# Patient Record
Sex: Male | Born: 1990
Health system: Southern US, Community
[De-identification: ages and names within clinical notes are randomized; demographics above are authoritative.]

## PROBLEM LIST (undated history)

## (undated) HISTORY — PX: KNEE SURGERY: SHX244

## (undated) HISTORY — PX: FOOT SURGERY: SHX648

---
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
## 2010-05-28 NOTE — Op Note (Signed)
NAME:  Stephen Frey, SELIGA NO.:  [id number]   MEDICAL RECORD NO.:  [id number]          PATIENT TYPE:  AMB   LOCATION:  SDS                          FACILITY:  [facility]   PHYSICIAN:  Claude Manges. Whitfield, M.D.DATE OF BIRTH:  28-Jul-1990   DATE OF PROCEDURE:  06/24/2005  DATE OF DISCHARGE:                                 OPERATIVE REPORT   PREOPERATIVE DIAGNOSIS:  Abscess left foot.   POSTOPERATIVE DIAGNOSIS:  Abscess left foot.   PROCEDURE:  Incision and drainage abscess left foot.   SURGEON:  Claude Manges. Cleophas Dunker, M.D.   ASSISTANT:  Arlys John D. Petrarca, P.A.-C.   ANESTHESIA:  General orotracheal.   COMPLICATIONS:  None.   HISTORY:  20 year old young man stepped with his barefoot on a tack  approximately 12 days ago. He was treated at home in [place] with Cipro  and eventually Levaquin, but continued to have problems with swelling and  pain in his foot.  He was seen in the office today with evidence of a  plantar abscess with pointing dorsally at the base of the second metatarsal  phalangeal joint.  He had an MRI scan this afternoon that revealed soft  tissue abscess without evidence of osteomyelitis or involvement of the  metatarsal phalangeal joint of the first, second, or third toe.  He is now  to have incision and drainage of the abscess.   PROCEDURE:  With the patient comfortable on the operating table and under  general laryngeal anesthesia, the left foot was prepped with DuraPrep from  the tips of the toes to the proximal leg.  Sterile draping was performed.  The extremity was elevated, it was Esmarch exsanguinated with the Esmarch  starting at the midfoot extending proximally.  There had been a small I&D  site performed previously that was probably about 3-4 mm in length. Applying  pressure on the plantar aspect of foot, we were able to express purulence.  This was sent for both anaerobic and aerobic culture.  I extended the open  area more proximally at  which point we encountered more purulence and what  appeared to be in infected tissue.  There was some necrotic tissue, as well,  which was sent for culture and sensitivity.  The wound was then further  opened bluntly. There was an abscessed cavity that was identified and this  was copiously irrigated with saline solution.  I did not see any further  purulence.  I could feel the flexor tendons but did not enter the joint.  The wound was again irrigated with warm saline solution and packed with  Iodoform gauze and a sterile bulky dressing was applied.  The patient  tolerated well without complications.      Claude Manges. Cleophas Dunker, M.D.    PWW/MEDQ  D:  06/24/2005  T:  06/25/2005  Job:  [id number]

## 2010-05-28 NOTE — Discharge Summary (Signed)
NAME:  Stephen Frey, Stephen Frey NO.:  [id number]   MEDICAL RECORD NO.:  [id number]          PATIENT TYPE:  INP   LOCATION:  6122                         FACILITY:  [facility]   PHYSICIAN:  Claude Manges. Whitfield, M.D.DATE OF BIRTH:  [birth year]   DATE OF ADMISSION:  06/24/2005  DATE OF DISCHARGE:  06/27/2005                                 DISCHARGE SUMMARY   ADMISSION DIAGNOSES:  1. Abscess left foot.  2. History of headaches.   DISCHARGE DIAGNOSES:  1. Abscess left foot with cellulitis and positive for methicillin-      resistant Staphylococcus aureus.  2. Reaction to vancomycin intravenous.  3. History of headaches.   SURGICAL PROCEDURES:  On June 24, 2005 Stephen Frey underwent an incision and  drainage of the abscess of the left foot by Dr. Claude Manges.  Whitfield assisted  by Jacqualine Code, P.A.-C.   COMPLICATIONS:  None.   CONSULTS:  1. Infectious disease consult by Dr. Ninetta Lights done on June 24, 2005  2. Pharmacy consult for vancomycin therapy June 24, 2005  3. PT consult June 25, 2005 in addition to a case management consult   HISTORY OF PRESENT ILLNESS:  This 20 year old white male patient presented  to Dr. Cleophas Dunker after stepping on a tack on June 4 in his bare feet.  It  was treated with soap and water and alcohol and Neosporin and he did fine  until June 9 when he developed increased pain.  It was treated then with  Epsom salts, however, continued to increase pain and saw a medical doctor on  June 11 and was started on Cipro and a tetanus booster.  By June 13 it was  unimproved and he was having increased redness and antibiotic was switched  to Levaquin.  He was then seen in our office on the day of admission and I&D  was done in the office and then sent for an MRI.  He is being admitted now  for I&D of an abscess.   HOSPITAL COURSE:  Stephen Frey was admitted initially and seen by infectious  disease.  They recommended treatment with vancomycin initially.  He  underwent surgery later in the day and tolerated that well.  On postop day  one Tmax was 97.9, vitals were stable.  Foot was improved with decreased  redness.  He was started on therapy per protocol.  Infectious disease  continued to see him and did recommend the vancomycin even though he had had  a reaction the night before with some swelling and redness.  They  recommended treating him with Benadryl prior to the antibiotic infusion and  continuing that at this time.  He did tolerate that that evening.   On postop day two improvement of the foot was significant.  Culture had  shown Staph but no sensitivities were available at that time.  He was  afebrile, vitals stable.  He was continued on antibiotics.   On June 18 he was doing well and was felt to be close to ready for discharge  home.  His culture report did show few MRSA  but it was sensitive to  gentamycin, Levaquin, Bactrim, vancomycin, rifampin, and tetracycline.  Infectious disease felt he would be okay to be discharged home on oral  doxycycline.  He did tolerate that dose without difficulty.  He was  discharged home later that day.   DISCHARGE INSTRUCTIONS:   DIET:  He can resume his regular pre hospitalization diet.   ACTIVITY:  He can be out of bed weightbearing as tolerated on the left foot  with use of crutches.   WOUND CARE:  He is to keep the dressing clean and dry and follow up Dr.  Cleophas Dunker in our office Wednesday after discharge.  He is not to get that  foot wet nor to change the dressing.  He is to notify Dr. Cleophas Dunker of  temperature greater than 101.5, chills, pain unrelieved by pain meds, or  foul swelling drainage from the wound.   MEDICATIONS:  He may resume his home meds.  In addition to that he has  doxycycline 100 mg p.o. b.i.d. for 10 days.   FOLLOW-UP:  Follow up with Dr. Cleophas Dunker in our office on Wednesday after  discharge.   LABORATORY DATA:  MRI done of the left foot on June 15 showed  phlegmonous  changes within the plantar soft tissue of the level of second  intermetatarsal interphalangeal space, negative for soft tissue abscess,  evidence of osteomyelitis, or septic joint.   Hemoglobin/hematocrit ranged from 14.4 and 41.9 on the 15th to 13.9 and 39.7  on the 16th.  White count went from 8.6 on the 15th to 12.3 on the 16th.  Platelets went from 167 on the 15th to 147 on the 16th.   Glucose was elevated on June 15 at 101.   Moderate white cells were obtained from the left foot abscess with rare  squamous epithelial cells, but no organisms seen but it did grow out a few  MRSA which was sensitive to gentamycin, Levaquin, rifampin, tetracycline,  Bactrim, and vancomycin.  All other laboratory studies were within normal  limits.      Stephen Frey, P.A.      Claude Manges. Cleophas Dunker, M.D.  Electronically Signed    KED/MEDQ  D:  08/30/2005  T:  08/30/2005  Job:  [id number]

## 2015-10-11 DIAGNOSIS — R110 Nausea: Secondary | ICD-10-CM | POA: Diagnosis not present

## 2015-10-11 DIAGNOSIS — R6889 Other general symptoms and signs: Secondary | ICD-10-CM | POA: Diagnosis not present

## 2015-10-11 DIAGNOSIS — J069 Acute upper respiratory infection, unspecified: Secondary | ICD-10-CM | POA: Diagnosis not present

## 2016-02-15 ENCOUNTER — Emergency Department (HOSPITAL_COMMUNITY)
Admission: EM | Admit: 2016-02-15 | Discharge: 2016-02-16 | Disposition: A | Discharge status: Home / Self Care | Payer: BLUE CROSS/BLUE SHIELD | Attending: Emergency Medicine | Admitting: Emergency Medicine

## 2016-02-15 ENCOUNTER — Encounter (HOSPITAL_COMMUNITY): Payer: Self-pay

## 2016-02-15 DIAGNOSIS — Z79899 Other long term (current) drug therapy: Secondary | ICD-10-CM | POA: Insufficient documentation

## 2016-02-15 DIAGNOSIS — R7989 Other specified abnormal findings of blood chemistry: Secondary | ICD-10-CM

## 2016-02-15 DIAGNOSIS — R112 Nausea with vomiting, unspecified: Secondary | ICD-10-CM

## 2016-02-15 DIAGNOSIS — E860 Dehydration: Secondary | ICD-10-CM

## 2016-02-15 DIAGNOSIS — R945 Abnormal results of liver function studies: Secondary | ICD-10-CM

## 2016-02-15 LAB — COMPREHENSIVE METABOLIC PANEL
ALBUMIN: 4.7 g/dL (ref 3.5–5.0)
ALT: 104 U/L — ABNORMAL HIGH (ref 17–63)
ANION GAP: 26 — AB (ref 5–15)
AST: 134 U/L — ABNORMAL HIGH (ref 15–41)
Alkaline Phosphatase: 64 U/L (ref 38–126)
BUN: 10 mg/dL (ref 6–20)
CHLORIDE: 92 mmol/L — AB (ref 101–111)
CO2: 17 mmol/L — AB (ref 22–32)
Calcium: 9.7 mg/dL (ref 8.9–10.3)
Creatinine, Ser: 1.01 mg/dL (ref 0.61–1.24)
GFR calc Af Amer: >60 mL/min (ref >60)
GFR calc non Af Amer: >60 mL/min (ref >60)
GLUCOSE: 99 mg/dL (ref 65–99)
Potassium: 3.3 mmol/L — ABNORMAL LOW (ref 3.5–5.1)
SODIUM: 135 mmol/L (ref 135–145)
TOTAL PROTEIN: 8.2 g/dL — AB (ref 6.5–8.1)
Total Bilirubin: 3.5 mg/dL — ABNORMAL HIGH (ref 0.3–1.2)

## 2016-02-15 LAB — CBC
HEMATOCRIT: 45.7 % (ref 39.0–52.0)
HEMOGLOBIN: 17.6 g/dL — AB (ref 13.0–17.0)
MCH: 33 pg (ref 26.0–34.0)
MCHC: 38.5 g/dL — AB (ref 30.0–36.0)
MCV: 85.7 fL (ref 78.0–100.0)
Platelets: 159 10*3/uL (ref 150–400)
RBC: 5.33 MIL/uL (ref 4.22–5.81)
RDW: 12.3 % (ref 11.5–15.5)
WBC: 16.6 10*3/uL — ABNORMAL HIGH (ref 4.0–10.5)

## 2016-02-15 LAB — LIPASE, BLOOD: LIPASE: 28 U/L (ref 11–51)

## 2016-02-15 MED ORDER — ONDANSETRON 4 MG PO TBDP
4.0000 mg | ORAL_TABLET | Freq: Once | ORAL | Status: AC | PRN
  Administered 2016-02-15: 4 mg via ORAL
  Filled 2016-02-15: qty 1

## 2016-02-15 MED ORDER — SODIUM CHLORIDE 0.9 % IV BOLUS (SEPSIS)
2000.0000 mL | Freq: Once | INTRAVENOUS | Status: AC
  Administered 2016-02-15: 2000 mL via INTRAVENOUS

## 2016-02-15 MED ORDER — ONDANSETRON HCL 4 MG/2ML IJ SOLN
4.0000 mg | Freq: Once | INTRAMUSCULAR | Status: AC
  Administered 2016-02-15: 4 mg via INTRAVENOUS
  Filled 2016-02-15: qty 2

## 2016-02-15 MED ORDER — KETOROLAC TROMETHAMINE 30 MG/ML IJ SOLN
30.0000 mg | Freq: Once | INTRAMUSCULAR | Status: AC
  Administered 2016-02-15: 30 mg via INTRAVENOUS
  Filled 2016-02-15: qty 1

## 2016-02-15 NOTE — ED Triage Notes (Signed)
Pt presents with c/o vomiting that started last night. Pt reports that he has been unable to keep anything down. Pt is tachycardic in triage at 130. Pt reports his hands and feet have been tingling intermittently since around 2 or 3 this morning. Pt denies any diarrhea.

## 2016-02-16 LAB — URINALYSIS, ROUTINE W REFLEX MICROSCOPIC
BILIRUBIN URINE: NEGATIVE
Bacteria, UA: NONE SEEN
GLUCOSE, UA: NEGATIVE mg/dL
KETONES UR: 80 mg/dL — AB
Leukocytes, UA: NEGATIVE
Nitrite: NEGATIVE
Protein, ur: 100 mg/dL — AB
Specific Gravity, Urine: 1.028 (ref 1.005–1.030)
Squamous Epithelial / LPF: NONE SEEN
pH: 5 (ref 5.0–8.0)

## 2016-02-16 MED ORDER — ONDANSETRON 8 MG PO TBDP: 8.0000 mg | ORAL_TABLET | Freq: Three times a day (TID) | ORAL | 0 refills | Status: DC | PRN

## 2016-02-16 NOTE — ED Notes (Signed)
Patient was alert, oriented and stable upon discharge. RN went over AVS and patient had no further questions.  

## 2016-02-16 NOTE — ED Provider Notes (Signed)
[facility]-EMERGENCY DEPT Provider Note   CSN: [id number] Arrival date & time: 02/15/16  1908     History   Chief Complaint Chief Complaint  Patient presents with  . Emesis    HPI Stephen Frey is a 26 y.o. male.  The history is provided by the patient and a parent.   Patient presents to the emergency department complaining of nausea and vomiting since last night.  Reports decreased oral intake and feels though he might be dehydrated.  Tachycardic in triage the 130s.  Denies diarrhea.  No abdominal complaints.  Symptoms are moderate in severity.  No hematemesis.  No melena or hematochezia.  No recent sick contacts.  He works in a [employer]    History reviewed. No pertinent past medical history.  There are no active problems to display for this patient.   Past Surgical History:  Procedure Laterality Date  . FOOT SURGERY    . KNEE SURGERY         Home Medications    Prior to Admission medications   Medication Sig Start Date End Date Taking? Authorizing Provider  ibuprofen (ADVIL,MOTRIN) 200 MG tablet Take 200 mg by mouth every 6 (six) hours as needed for moderate pain.   Yes Historical Provider, MD  ondansetron (ZOFRAN ODT) 8 MG disintegrating tablet Take 1 tablet (8 mg total) by mouth every 8 (eight) hours as needed for nausea or vomiting. 02/16/16   Azalia BilisKevin Paz Winsett, MD    Family History No family history on file.  Social History Social History  Substance Use Topics  . Smoking status: Never Smoker  . Smokeless tobacco: Current User    Types: Snuff  . Alcohol use Yes     Comment: socially      Allergies   Vancomycin   Review of Systems Review of Systems  All other systems reviewed and are negative.    Physical Exam Updated Vital Signs BP 139/87   Pulse 97   Temp 98.4 F (36.9 C) (Oral)   Resp 18   Ht 6\' 3"  (1.905 m)   Wt 160 lb (72.6 kg)   SpO2 96%   BMI 20.00 kg/m   Physical Exam  Constitutional: He is oriented to person, place, and time. He  appears well-developed and well-nourished.  HENT:  Head: Normocephalic and atraumatic.  Eyes: EOM are normal.  Neck: Normal range of motion.  Cardiovascular: Regular rhythm, normal heart sounds and intact distal pulses.   Tachycardia  Pulmonary/Chest: Effort normal and breath sounds normal. No respiratory distress.  Abdominal: Soft. He exhibits no distension. There is no tenderness.  Musculoskeletal: Normal range of motion.  Neurological: He is alert and oriented to person, place, and time.  Skin: Skin is warm and dry.  Psychiatric: He has a normal mood and affect. Judgment normal.  Nursing note and vitals reviewed.    ED Treatments / Results  Labs (all labs ordered are listed, but only abnormal results are displayed) Labs Reviewed  COMPREHENSIVE METABOLIC PANEL - Abnormal; Notable for the following:       Result Value   Potassium 3.3 (*)    Chloride 92 (*)    CO2 17 (*)    Total Protein 8.2 (*)    AST 134 (*)    ALT 104 (*)    Total Bilirubin 3.5 (*)    Anion gap 26 (*)    All other components within normal limits  CBC - Abnormal; Notable for the following:    WBC 16.6 (*)  Hemoglobin 17.6 (*)    MCHC 38.5 (*)    All other components within normal limits  URINALYSIS, ROUTINE W REFLEX MICROSCOPIC - Abnormal; Notable for the following:    Color, Urine AMBER (*)    Hgb urine dipstick SMALL (*)    Ketones, ur 80 (*)    Protein, ur 100 (*)    All other components within normal limits  LIPASE, BLOOD  HEPATITIS PANEL, ACUTE    EKG  EKG Interpretation None       Radiology No results found.  Procedures Procedures (including critical care time)  Medications Ordered in ED Medications  ondansetron (ZOFRAN-ODT) disintegrating tablet 4 mg (4 mg Oral Given 02/15/16 1928)  sodium chloride 0.9 % bolus 2,000 mL (0 mLs Intravenous Stopped 02/16/16 0053)  ondansetron (ZOFRAN) injection 4 mg (4 mg Intravenous Given 02/15/16 2321)  ketorolac (TORADOL) 30 MG/ML injection 30 mg  (30 mg Intravenous Given 02/15/16 2321)     Initial Impression / Assessment and Plan / ED Course  I have reviewed the triage vital signs and the nursing notes.  Pertinent labs & imaging results that were available during my care of the patient were reviewed by me and considered in my medical decision making (see chart for details).     1:38 AM Feels much better at this time.  Discharge home in good condition.  Likely dehydrated on arrival.  Repeat abdominal exam without tenderness.  Elevated LFTs and bilirubin likely acute phase however acute hepatitis panel sent.  He will need to follow-up with his primary care physician in one week for repeat liver function tests.  He understands to return to the ER for new or worsening symptoms.  I've asked that he follow-up with his primary care physician for the final results of his acute hepatitis panel  Final Clinical Impressions(s) / ED Diagnoses   Final diagnoses:  Nausea and vomiting, intractability of vomiting not specified, unspecified vomiting type  Dehydration  Elevated liver function tests    New Prescriptions New Prescriptions   ONDANSETRON (ZOFRAN ODT) 8 MG DISINTEGRATING TABLET    Take 1 tablet (8 mg total) by mouth every 8 (eight) hours as needed for nausea or vomiting.     Azalia Bilis, MD 02/16/16 [phone]

## 2016-02-16 NOTE — Discharge Instructions (Signed)
Please call your doctor for repeat liver function tests in 1 week

## 2016-02-17 LAB — HEPATITIS PANEL, ACUTE
HCV Ab: 0.1 s/co ratio (ref 0.0–0.9)
HEP A IGM: NEGATIVE
Hep B C IgM: NEGATIVE
Hepatitis B Surface Ag: NEGATIVE

## 2016-02-21 ENCOUNTER — Encounter (HOSPITAL_COMMUNITY): Payer: Self-pay | Admitting: Emergency Medicine

## 2016-02-21 ENCOUNTER — Emergency Department (HOSPITAL_COMMUNITY)
Admission: EM | Admit: 2016-02-21 | Discharge: 2016-02-21 | Disposition: A | Discharge status: Home / Self Care | Payer: BLUE CROSS/BLUE SHIELD | Attending: Emergency Medicine | Admitting: Emergency Medicine

## 2016-02-21 ENCOUNTER — Emergency Department (HOSPITAL_COMMUNITY): Payer: BLUE CROSS/BLUE SHIELD

## 2016-02-21 DIAGNOSIS — R1031 Right lower quadrant pain: Secondary | ICD-10-CM | POA: Diagnosis not present

## 2016-02-21 DIAGNOSIS — R112 Nausea with vomiting, unspecified: Secondary | ICD-10-CM

## 2016-02-21 DIAGNOSIS — R1110 Vomiting, unspecified: Secondary | ICD-10-CM | POA: Diagnosis not present

## 2016-02-21 DIAGNOSIS — R1011 Right upper quadrant pain: Secondary | ICD-10-CM | POA: Diagnosis not present

## 2016-02-21 DIAGNOSIS — F17290 Nicotine dependence, other tobacco product, uncomplicated: Secondary | ICD-10-CM | POA: Insufficient documentation

## 2016-02-21 LAB — COMPREHENSIVE METABOLIC PANEL
ALT: 147 U/L — AB (ref 17–63)
AST: 113 U/L — AB (ref 15–41)
Albumin: 4.5 g/dL (ref 3.5–5.0)
Alkaline Phosphatase: 58 U/L (ref 38–126)
Anion gap: 16 — ABNORMAL HIGH (ref 5–15)
BUN: 11 mg/dL (ref 6–20)
CHLORIDE: 88 mmol/L — AB (ref 101–111)
CO2: 28 mmol/L (ref 22–32)
CREATININE: 0.94 mg/dL (ref 0.61–1.24)
Calcium: 9.9 mg/dL (ref 8.9–10.3)
GFR calc non Af Amer: >60 mL/min (ref >60)
Glucose, Bld: 88 mg/dL (ref 65–99)
POTASSIUM: 3.5 mmol/L (ref 3.5–5.1)
SODIUM: 132 mmol/L — AB (ref 135–145)
Total Bilirubin: 3 mg/dL — ABNORMAL HIGH (ref 0.3–1.2)
Total Protein: 7.9 g/dL (ref 6.5–8.1)

## 2016-02-21 LAB — URINALYSIS, ROUTINE W REFLEX MICROSCOPIC
Bilirubin Urine: NEGATIVE
GLUCOSE, UA: NEGATIVE mg/dL
HGB URINE DIPSTICK: NEGATIVE
Ketones, ur: 20 mg/dL — AB
Leukocytes, UA: NEGATIVE
Nitrite: NEGATIVE
PH: 5 (ref 5.0–8.0)
PROTEIN: NEGATIVE mg/dL
Specific Gravity, Urine: 1.017 (ref 1.005–1.030)

## 2016-02-21 LAB — CBC
HEMATOCRIT: 44.4 % (ref 39.0–52.0)
Hemoglobin: 17.3 g/dL — ABNORMAL HIGH (ref 13.0–17.0)
MCH: 34.1 pg — AB (ref 26.0–34.0)
MCHC: 39 g/dL — ABNORMAL HIGH (ref 30.0–36.0)
MCV: 87.4 fL (ref 78.0–100.0)
PLATELETS: 153 10*3/uL (ref 150–400)
RBC: 5.08 MIL/uL (ref 4.22–5.81)
RDW: 12.3 % (ref 11.5–15.5)
WBC: 6.5 10*3/uL (ref 4.0–10.5)

## 2016-02-21 LAB — LIPASE, BLOOD: LIPASE: 24 U/L (ref 11–51)

## 2016-02-21 MED ORDER — PROMETHAZINE HCL 25 MG PO TABS: 25.0000 mg | ORAL_TABLET | Freq: Four times a day (QID) | ORAL | 0 refills | Status: DC | PRN

## 2016-02-21 MED ORDER — SODIUM CHLORIDE 0.9 % IV BOLUS (SEPSIS)
1000.0000 mL | Freq: Once | INTRAVENOUS | Status: AC
  Administered 2016-02-21: 1000 mL via INTRAVENOUS

## 2016-02-21 MED ORDER — ONDANSETRON HCL 4 MG/2ML IJ SOLN
4.0000 mg | Freq: Once | INTRAMUSCULAR | Status: AC
  Administered 2016-02-21: 4 mg via INTRAVENOUS
  Filled 2016-02-21: qty 2

## 2016-02-21 MED ORDER — ONDANSETRON 4 MG PO TBDP
4.0000 mg | ORAL_TABLET | Freq: Once | ORAL | Status: AC | PRN
  Administered 2016-02-21: 4 mg via ORAL
  Filled 2016-02-21: qty 1

## 2016-02-21 MED ORDER — IOPAMIDOL (ISOVUE-300) INJECTION 61%
INTRAVENOUS | Status: AC
  Administered 2016-02-21: 30 mL
  Filled 2016-02-21: qty 30

## 2016-02-21 MED ORDER — IOPAMIDOL (ISOVUE-300) INJECTION 61%
100.0000 mL | Freq: Once | INTRAVENOUS | Status: AC | PRN
  Administered 2016-02-21: 100 mL via INTRAVENOUS

## 2016-02-21 NOTE — ED Notes (Signed)
Patient is resting comfortably. 

## 2016-02-21 NOTE — ED Notes (Signed)
Pt tolerating water PO

## 2016-02-21 NOTE — ED Notes (Signed)
Family at bedside. 

## 2016-02-21 NOTE — ED Triage Notes (Signed)
Patient complains of abdominal pain with nausea and vomiting. [facility] was [facility] Monday and was negative for flu, but told he may have hepatitis. Pt was told to come back to ED if vomiting did not resolve.

## 2016-02-21 NOTE — Discharge Instructions (Signed)
Follow-up with gastroenterology as needed to further monitor your liver function tests. Keep yourself hydrated.

## 2016-02-21 NOTE — ED Provider Notes (Signed)
[facility]-EMERGENCY DEPT Provider Note   CSN: [id number] Arrival date & time: 02/21/16  4098   By signing my name below, I, Bobbie Stack, attest that this documentation has been prepared under the direction and in the presence of Benjiman Core, MD. Electronically Signed: Bobbie Stack, Scribe. 02/21/16. 10:05 AM. History   Chief Complaint Chief Complaint  Patient presents with  . Abdominal Pain     The history is provided by the patient. No language interpreter was used.   HPI Comments: Stephen Frey is a 26 y.o. male who presents to the Emergency Department complaining of RLQ and RUQ abdominal pain that began earlier today. Patient states that he has not been feeling well for the past week and a half. He reports nausea and vomiting. He was seen 6 days ago at [facility] and felt fine shortly after being released the next morning. Later that night his flu-like symptoms began to return. He denies any fevers. He has tried tylenol with no significant relief. He states that he is usually in pretty good health.    History reviewed. No pertinent past medical history.  There are no active problems to display for this patient.   Past Surgical History:  Procedure Laterality Date  . FOOT SURGERY    . KNEE SURGERY         Home Medications    Prior to Admission medications   Medication Sig Start Date End Date Taking? Authorizing Provider  ibuprofen (ADVIL,MOTRIN) 200 MG tablet Take 200 mg by mouth every 6 (six) hours as needed for moderate pain.   Yes Historical Provider, MD  ondansetron (ZOFRAN ODT) 8 MG disintegrating tablet Take 1 tablet (8 mg total) by mouth every 8 (eight) hours as needed for nausea or vomiting. 02/16/16  Yes Azalia Bilis, MD  promethazine (PHENERGAN) 25 MG tablet Take 1 tablet (25 mg total) by mouth every 6 (six) hours as needed for nausea. 02/21/16   Benjiman Core, MD    Family History No family history on file.  Social History Social History    Substance Use Topics  . Smoking status: Never Smoker  . Smokeless tobacco: Current User    Types: Snuff  . Alcohol use Yes     Comment: socially      Allergies   Vancomycin   Review of Systems Review of Systems  Constitutional: Negative for fever.  Gastrointestinal: Positive for abdominal pain, nausea and vomiting.  All other systems reviewed and are negative.    Physical Exam Updated Vital Signs BP 136/89   Pulse 63   Temp 98.2 F (36.8 C) (Oral)   Resp 18   Ht 6\' 3"  (1.905 m)   Wt 142 lb (64.4 kg)   SpO2 98%   BMI 17.75 kg/m   Physical Exam  Constitutional: He appears well-developed and well-nourished.  HENT:  Head: Normocephalic and atraumatic.  Eyes: EOM are normal. Pupils are equal, round, and reactive to light.  Neck: Neck supple.  Cardiovascular: Regular rhythm.   Mild tachycardia.  Pulmonary/Chest: Effort normal and breath sounds normal. No stridor.  Lungs clear.  Abdominal: There is tenderness. There is no rebound and no guarding.  Minimal to no RUQ tenderness. RLQ tenderness. No rebound and guarding.     ED Treatments / Results  DIAGNOSTIC STUDIES: Oxygen Saturation is 100% on RA, normal by my interpretation.    COORDINATION OF CARE: 9:50 AM Discussed treatment plan with pt at bedside and pt agreed to plan. I will follow up on the  patients labs.  Labs (all labs ordered are listed, but only abnormal results are displayed) Labs Reviewed  COMPREHENSIVE METABOLIC PANEL - Abnormal; Notable for the following:       Result Value   Sodium 132 (*)    Chloride 88 (*)    AST 113 (*)    ALT 147 (*)    Total Bilirubin 3.0 (*)    Anion gap 16 (*)    All other components within normal limits  CBC - Abnormal; Notable for the following:    Hemoglobin 17.3 (*)    MCH 34.1 (*)    MCHC 39.0 (*)    All other components within normal limits  URINALYSIS, ROUTINE W REFLEX MICROSCOPIC - Abnormal; Notable for the following:    Color, Urine AMBER (*)     Ketones, ur 20 (*)    All other components within normal limits  LIPASE, BLOOD    EKG  EKG Interpretation None       Radiology Ct Abdomen Pelvis W Contrast  Result Date: 02/21/2016 CLINICAL DATA:  Right lower quadrant pain. Elevated LFTs. Nausea, vomiting. EXAM: CT ABDOMEN AND PELVIS WITH CONTRAST TECHNIQUE: Multidetector CT imaging of the abdomen and pelvis was performed using the standard protocol following bolus administration of intravenous contrast. CONTRAST:  1 ISOVUE-300 IOPAMIDOL (ISOVUE-300) INJECTION 61%, 100mL ISOVUE-300 IOPAMIDOL (ISOVUE-300) INJECTION 61% COMPARISON:  10/25/2005 FINDINGS: Lower chest: Lung bases are clear. No effusions. Heart is normal size. Hepatobiliary: Diffuse fatty infiltration of the liver. No focal abnormality. Gallbladder unremarkable. Pancreas: No focal abnormality or ductal dilatation. Spleen: No focal abnormality.  Normal size. Adrenals/Urinary Tract: No adrenal abnormality. No focal renal abnormality. No stones or hydronephrosis. Urinary bladder is unremarkable. Stomach/Bowel: Normal appendix. Stomach, large and small bowel grossly unremarkable. Vascular/Lymphatic: No evidence of aneurysm or adenopathy. Reproductive: No visible focal abnormality. Other: No free fluid or free air. Musculoskeletal: No acute bony abnormality. IMPRESSION: No acute findings in the abdomen or pelvis. Fatty infiltration of the liver. Electronically Signed   By: Charlett NoseKevin  Dover M.D.   On: 02/21/2016 11:52    Procedures Procedures (including critical care time)  Medications Ordered in ED Medications  ondansetron (ZOFRAN-ODT) disintegrating tablet 4 mg (4 mg Oral Given 02/21/16 0825)  sodium chloride 0.9 % bolus 1,000 mL (0 mLs Intravenous Stopped 02/21/16 1152)  ondansetron (ZOFRAN) injection 4 mg (4 mg Intravenous Given 02/21/16 1011)  sodium chloride 0.9 % bolus 1,000 mL (0 mLs Intravenous Stopped 02/21/16 1252)  iopamidol (ISOVUE-300) 61 % injection (30 mLs  Contrast Given  02/21/16 1132)  iopamidol (ISOVUE-300) 61 % injection 100 mL (100 mLs Intravenous Contrast Given 02/21/16 1132)     Initial Impression / Assessment and Plan / ED Course  I have reviewed the triage vital signs and the nursing notes.  Pertinent labs & imaging results that were available during my care of the patient were reviewed by me and considered in my medical decision making (see chart for details).     Patient with nausea vomiting abdominal pain. [facility] and mildly elevated LFTs. They're now stable today. Patient states she felt little better than began to feel worse again. No real diarrhea. Has had some abdominal pain. CT scan done and was reassuring. Will discharge home. Lab shows some dehydration but otherwise reassuring. Tolerated orals will discharge to follow-up with GI as needed. No other hepatotoxic intake but does have somewhat frequent alcohol. No Tylenol intake.  Final Clinical Impressions(s) / ED Diagnoses   Final diagnoses:  Non-intractable  vomiting with nausea, unspecified vomiting type    New Prescriptions New Prescriptions   PROMETHAZINE (PHENERGAN) 25 MG TABLET    Take 1 tablet (25 mg total) by mouth every 6 (six) hours as needed for nausea.   I personally performed the services described in this documentation, which was scribed in my presence. The recorded information has been reviewed and is accurate.      Benjiman Core, MD 02/21/16 1415

## 2016-03-22 ENCOUNTER — Encounter (HOSPITAL_COMMUNITY): Payer: Self-pay | Admitting: Cardiology

## 2016-03-22 ENCOUNTER — Emergency Department (HOSPITAL_COMMUNITY)
Admission: EM | Admit: 2016-03-22 | Discharge: 2016-03-22 | Disposition: A | Discharge status: Home / Self Care | Payer: BLUE CROSS/BLUE SHIELD | Attending: Emergency Medicine | Admitting: Emergency Medicine

## 2016-03-22 DIAGNOSIS — F1010 Alcohol abuse, uncomplicated: Secondary | ICD-10-CM

## 2016-03-22 DIAGNOSIS — F10239 Alcohol dependence with withdrawal, unspecified: Secondary | ICD-10-CM | POA: Diagnosis not present

## 2016-03-22 DIAGNOSIS — Z79899 Other long term (current) drug therapy: Secondary | ICD-10-CM | POA: Diagnosis not present

## 2016-03-22 DIAGNOSIS — Z791 Long term (current) use of non-steroidal anti-inflammatories (NSAID): Secondary | ICD-10-CM | POA: Insufficient documentation

## 2016-03-22 DIAGNOSIS — F17290 Nicotine dependence, other tobacco product, uncomplicated: Secondary | ICD-10-CM | POA: Diagnosis not present

## 2016-03-22 DIAGNOSIS — R6889 Other general symptoms and signs: Secondary | ICD-10-CM

## 2016-03-22 LAB — CBC WITH DIFFERENTIAL/PLATELET
BASOS PCT: 1 %
Basophils Absolute: 0 10*3/uL (ref 0.0–0.1)
EOS ABS: 0 10*3/uL (ref 0.0–0.7)
EOS PCT: 0 %
HCT: 46.1 % (ref 39.0–52.0)
Hemoglobin: 17.6 g/dL — ABNORMAL HIGH (ref 13.0–17.0)
Lymphocytes Relative: 23 %
Lymphs Abs: 1.3 10*3/uL (ref 0.7–4.0)
MCH: 33.7 pg (ref 26.0–34.0)
MCHC: 38.2 g/dL — ABNORMAL HIGH (ref 30.0–36.0)
MCV: 88.3 fL (ref 78.0–100.0)
MONO ABS: 0.5 10*3/uL (ref 0.1–1.0)
MONOS PCT: 9 %
Neutro Abs: 3.8 10*3/uL (ref 1.7–7.7)
Neutrophils Relative %: 67 %
Platelets: 130 10*3/uL — ABNORMAL LOW (ref 150–400)
RBC: 5.22 MIL/uL (ref 4.22–5.81)
RDW: 12.5 % (ref 11.5–15.5)
WBC: 5.7 10*3/uL (ref 4.0–10.5)

## 2016-03-22 LAB — URINALYSIS, ROUTINE W REFLEX MICROSCOPIC
Bilirubin Urine: NEGATIVE
Glucose, UA: NEGATIVE mg/dL
Hgb urine dipstick: NEGATIVE
KETONES UR: 20 mg/dL — AB
LEUKOCYTES UA: NEGATIVE
NITRITE: NEGATIVE
PROTEIN: NEGATIVE mg/dL
Specific Gravity, Urine: 1.016 (ref 1.005–1.030)
pH: 6 (ref 5.0–8.0)

## 2016-03-22 LAB — COMPREHENSIVE METABOLIC PANEL
ALBUMIN: 4.2 g/dL (ref 3.5–5.0)
ALT: 102 U/L — ABNORMAL HIGH (ref 17–63)
AST: 150 U/L — ABNORMAL HIGH (ref 15–41)
Alkaline Phosphatase: 54 U/L (ref 38–126)
Anion gap: 19 — ABNORMAL HIGH (ref 5–15)
BUN: 8 mg/dL (ref 6–20)
CO2: 23 mmol/L (ref 22–32)
Calcium: 9.4 mg/dL (ref 8.9–10.3)
Chloride: 93 mmol/L — ABNORMAL LOW (ref 101–111)
Creatinine, Ser: 0.93 mg/dL (ref 0.61–1.24)
GFR calc non Af Amer: >60 mL/min (ref >60)
GLUCOSE: 94 mg/dL (ref 65–99)
POTASSIUM: 3.1 mmol/L — AB (ref 3.5–5.1)
SODIUM: 135 mmol/L (ref 135–145)
TOTAL PROTEIN: 7.4 g/dL (ref 6.5–8.1)
Total Bilirubin: 2.9 mg/dL — ABNORMAL HIGH (ref 0.3–1.2)

## 2016-03-22 LAB — LIPASE, BLOOD: Lipase: 19 U/L (ref 11–51)

## 2016-03-22 LAB — ETHANOL: ALCOHOL ETHYL (B): 93 mg/dL — AB (ref <5)

## 2016-03-22 MED ORDER — PROMETHAZINE HCL 25 MG PO TABS: 25.0000 mg | ORAL_TABLET | Freq: Four times a day (QID) | ORAL | 0 refills | Status: DC | PRN

## 2016-03-22 MED ORDER — SODIUM CHLORIDE 0.9 % IV BOLUS (SEPSIS)
1000.0000 mL | Freq: Once | INTRAVENOUS | Status: AC
  Administered 2016-03-22: 1000 mL via INTRAVENOUS

## 2016-03-22 MED ORDER — CHLORDIAZEPOXIDE HCL 25 MG PO CAPS
25.0000 mg | ORAL_CAPSULE | Freq: Once | ORAL | Status: AC
  Administered 2016-03-22: 25 mg via ORAL
  Filled 2016-03-22: qty 1

## 2016-03-22 MED ORDER — ONDANSETRON HCL 4 MG/2ML IJ SOLN
4.0000 mg | Freq: Once | INTRAMUSCULAR | Status: AC
  Administered 2016-03-22: 4 mg via INTRAVENOUS
  Filled 2016-03-22: qty 2

## 2016-03-22 MED ORDER — PROMETHAZINE HCL 25 MG/ML IJ SOLN
12.5000 mg | Freq: Once | INTRAMUSCULAR | Status: AC
  Administered 2016-03-22: 12.5 mg via INTRAVENOUS
  Filled 2016-03-22: qty 1

## 2016-03-22 MED ORDER — CHLORDIAZEPOXIDE HCL 25 MG PO CAPS: ORAL_CAPSULE | ORAL | 0 refills | Status: DC

## 2016-03-22 MED ORDER — PROMETHAZINE HCL 12.5 MG PO TABS
25.0000 mg | ORAL_TABLET | Freq: Once | ORAL | Status: AC
  Administered 2016-03-22: 25 mg via ORAL
  Filled 2016-03-22: qty 2

## 2016-03-22 MED ORDER — POTASSIUM CHLORIDE CRYS ER 20 MEQ PO TBCR
40.0000 meq | EXTENDED_RELEASE_TABLET | Freq: Two times a day (BID) | ORAL | Status: DC
  Administered 2016-03-22: 40 meq via ORAL
  Filled 2016-03-22: qty 2

## 2016-03-22 NOTE — ED Triage Notes (Signed)
Pt states he is going through withdrawal from alcohol.  Last drink was Saturday.  Started vomiting Sunday.

## 2016-03-22 NOTE — ED Notes (Signed)
Pt given gingerale and coke to drink.

## 2016-03-22 NOTE — ED Notes (Signed)
Pt made aware to return if symptoms worsen or if any life threatening symptoms occur.   

## 2016-03-22 NOTE — ED Provider Notes (Signed)
[facility]-EMERGENCY DEPT Provider Note   CSN: [id number] Arrival date & time: 03/22/16  0734     History   Chief Complaint Chief Complaint  Patient presents with  . Withdrawal    HPI Stephen Frey is a 26 y.o. male presenting with nausea, vomiting and tremors which he equates to alcohol withdrawal.  He reports TNTC episodes of vomiting and intermittent hot flashes, denies abdominal pain or diarrhea. He has had no oral intake in 3 days. He denies hematemesis, but mother states she found very dark colored vomit in his room at school when she picked him up. He has had reduced urine output.  He endorses daily alcohol intake for at least the past year, but is unable to quantify daily amounts of ingestion.  He states prior to the past school year [employer] at [employer]) he had more of a weekend binge type pattern with progression to daily intake.  He has never withdrawn from etoh before, denies seizure history or hallucinations.  He denies any other substance abuse. He presents with his mother who has researched rehab centers and they are leaning toward an outpatient tx so he can finish his school semester.  HPI  History reviewed. No pertinent past medical history.  There are no active problems to display for this patient.   Past Surgical History:  Procedure Laterality Date  . FOOT SURGERY    . KNEE SURGERY         Home Medications    Prior to Admission medications   Medication Sig Start Date End Date Taking? Authorizing Provider  ibuprofen (ADVIL,MOTRIN) 200 MG tablet Take 200 mg by mouth every 6 (six) hours as needed for moderate pain.   Yes Historical Provider, MD  Multiple Vitamin (MULTIVITAMIN WITH MINERALS) TABS tablet Take 1 tablet by mouth daily.   Yes Historical Provider, MD  ondansetron (ZOFRAN ODT) 8 MG disintegrating tablet Take 1 tablet (8 mg total) by mouth every 8 (eight) hours as needed for nausea or vomiting. 02/16/16  Yes Azalia Bilis, MD  chlordiazePOXIDE (LIBRIUM) 25 MG  capsule 50mg  PO TID x 1D, then 25-50mg  PO BID X 1D, then 25-50mg  PO QD X 1D 03/22/16   Burgess Amor, PA-C  promethazine (PHENERGAN) 25 MG tablet Take 1 tablet (25 mg total) by mouth every 6 (six) hours as needed for nausea or vomiting. 03/22/16   Burgess Amor, PA-C    Family History History reviewed. No pertinent family history.  Social History Social History  Substance Use Topics  . Smoking status: Never Smoker  . Smokeless tobacco: Current User    Types: Snuff  . Alcohol use Yes     Comment: socially      Allergies   Vancomycin   Review of Systems Review of Systems  Constitutional: Positive for appetite change. Negative for fever.  HENT: Negative for congestion and sore throat.   Eyes: Negative.   Respiratory: Negative for chest tightness and shortness of breath.   Cardiovascular: Negative for chest pain.  Gastrointestinal: Positive for nausea and vomiting. Negative for abdominal pain and diarrhea.  Genitourinary: Positive for decreased urine volume.  Musculoskeletal: Negative for arthralgias, joint swelling and neck pain.  Skin: Negative.  Negative for rash and wound.  Neurological: Negative for dizziness, weakness, light-headedness, numbness and headaches.  Psychiatric/Behavioral: Negative.      Physical Exam Updated Vital Signs BP 123/88 (BP Location: Left Arm)   Pulse 79   Temp 98.1 F (36.7 C) (Oral)   Resp 20   Ht 6'  3" (1.905 m)   Wt 64.4 kg   SpO2 99%   BMI 17.75 kg/m   Physical Exam  Constitutional: He appears well-developed and well-nourished. No distress.  HENT:  Head: Normocephalic and atraumatic.  Mouth/Throat: Mucous membranes are dry.  Eyes: Conjunctivae are normal.  Neck: Normal range of motion.  Cardiovascular: Normal rate, regular rhythm, normal heart sounds and intact distal pulses.   Pulmonary/Chest: Effort normal and breath sounds normal. He has no wheezes.  Abdominal: Soft. Bowel sounds are normal. He exhibits no mass. There is no  tenderness. There is no rebound and no guarding.  Musculoskeletal: Normal range of motion.  Neurological: He is alert.  Skin: Skin is warm and dry.  Psychiatric: He has a normal mood and affect.  Nursing note and vitals reviewed.    ED Treatments / Results  Labs (all labs ordered are listed, but only abnormal results are displayed) Labs Reviewed  CBC WITH DIFFERENTIAL/PLATELET - Abnormal; Notable for the following:       Result Value   Hemoglobin 17.6 (*)    MCHC 38.2 (*)    Platelets 130 (*)    All other components within normal limits  COMPREHENSIVE METABOLIC PANEL - Abnormal; Notable for the following:    Potassium 3.1 (*)    Chloride 93 (*)    AST 150 (*)    ALT 102 (*)    Total Bilirubin 2.9 (*)    Anion gap 19 (*)    All other components within normal limits  ETHANOL - Abnormal; Notable for the following:    Alcohol, Ethyl (B) 93 (*)    All other components within normal limits  URINALYSIS, ROUTINE W REFLEX MICROSCOPIC - Abnormal; Notable for the following:    Ketones, ur 20 (*)    All other components within normal limits  LIPASE, BLOOD    EKG  EKG Interpretation None       Radiology No results found.  Procedures Procedures (including critical care time)  Medications Ordered in ED Medications  sodium chloride 0.9 % bolus 1,000 mL (0 mLs Intravenous Stopped 03/22/16 0936)  ondansetron (ZOFRAN) injection 4 mg (4 mg Intravenous Given 03/22/16 0832)  chlordiazePOXIDE (LIBRIUM) capsule 25 mg (25 mg Oral Given 03/22/16 0832)  sodium chloride 0.9 % bolus 1,000 mL (0 mLs Intravenous Stopped 03/22/16 1159)  promethazine (PHENERGAN) injection 12.5 mg (12.5 mg Intravenous Given 03/22/16 1120)  sodium chloride 0.9 % bolus 1,000 mL (0 mLs Intravenous Stopped 03/22/16 1447)  chlordiazePOXIDE (LIBRIUM) capsule 25 mg (25 mg Oral Given 03/22/16 1458)  promethazine (PHENERGAN) tablet 25 mg (25 mg Oral Given 03/22/16 1457)     Initial Impression / Assessment and Plan / ED  Course  I have reviewed the triage vital signs and the nursing notes.  Pertinent labs & imaging results that were available during my care of the patient were reviewed by me and considered in my medical decision making (see chart for details).     Pt with etoh abuse, last intake 72 hours ago, presenting with n/v, mild tremor with no oral intake in over 24 hours.  He appeared dehydrated, was given IV fluids, was tolerating PO intake prior to dc. Vital signs remained stable during visit.  No psychosis, no hallucinations/ denies h/o previous dt's.  Discussed various choices for detox and rehab, referrals given.  He was placed on phenergan and Librium for assistance with acute detox phase.  Mother present during visit who is proactive with getting him into rehab, currently undecided about  inpt vs outpt.    The patient appears reasonably screened and/or stabilized for discharge and I doubt any other medical condition or other [facility] requiring further screening, evaluation, or treatment in the ED at this time prior to discharge.   Final Clinical Impressions(s) / ED Diagnoses   Final diagnoses:  ETOH abuse  Withdrawal complaint    New Prescriptions Discharge Medication List as of 03/22/2016  2:36 PM    START taking these medications   Details  chlordiazePOXIDE (LIBRIUM) 25 MG capsule 50mg  PO TID x 1D, then 25-50mg  PO BID X 1D, then 25-50mg  PO QD X 1D, Print         Burgess Amor, PA-C 03/23/16 1248    Benjiman Core, MD 03/23/16 1623

## 2017-03-22 DIAGNOSIS — G4700 Insomnia, unspecified: Secondary | ICD-10-CM | POA: Diagnosis not present

## 2017-05-30 DIAGNOSIS — R945 Abnormal results of liver function studies: Secondary | ICD-10-CM | POA: Diagnosis not present

## 2017-05-30 DIAGNOSIS — Z6824 Body mass index (BMI) 24.0-24.9, adult: Secondary | ICD-10-CM | POA: Diagnosis not present

## 2017-05-30 DIAGNOSIS — F1010 Alcohol abuse, uncomplicated: Secondary | ICD-10-CM | POA: Diagnosis not present

## 2017-05-30 DIAGNOSIS — G4700 Insomnia, unspecified: Secondary | ICD-10-CM | POA: Diagnosis not present

## 2017-06-09 DIAGNOSIS — Z0000 Encounter for general adult medical examination without abnormal findings: Secondary | ICD-10-CM | POA: Diagnosis not present

## 2017-06-09 DIAGNOSIS — Z6824 Body mass index (BMI) 24.0-24.9, adult: Secondary | ICD-10-CM | POA: Diagnosis not present

## 2017-06-09 DIAGNOSIS — F5101 Primary insomnia: Secondary | ICD-10-CM | POA: Diagnosis not present

## 2017-06-09 DIAGNOSIS — M25561 Pain in right knee: Secondary | ICD-10-CM | POA: Diagnosis not present

## 2017-08-08 DIAGNOSIS — R112 Nausea with vomiting, unspecified: Secondary | ICD-10-CM | POA: Diagnosis not present

## 2017-08-08 DIAGNOSIS — Z0000 Encounter for general adult medical examination without abnormal findings: Secondary | ICD-10-CM | POA: Diagnosis not present

## 2017-08-08 DIAGNOSIS — Z6822 Body mass index (BMI) 22.0-22.9, adult: Secondary | ICD-10-CM | POA: Diagnosis not present

## 2017-08-08 DIAGNOSIS — R197 Diarrhea, unspecified: Secondary | ICD-10-CM | POA: Diagnosis not present

## 2017-08-08 DIAGNOSIS — G4700 Insomnia, unspecified: Secondary | ICD-10-CM | POA: Diagnosis not present

## 2017-08-08 DIAGNOSIS — R945 Abnormal results of liver function studies: Secondary | ICD-10-CM | POA: Diagnosis not present

## 2017-08-08 DIAGNOSIS — M25561 Pain in right knee: Secondary | ICD-10-CM | POA: Diagnosis not present

## 2017-08-08 DIAGNOSIS — Z6824 Body mass index (BMI) 24.0-24.9, adult: Secondary | ICD-10-CM | POA: Diagnosis not present

## 2017-12-13 DIAGNOSIS — G4700 Insomnia, unspecified: Secondary | ICD-10-CM | POA: Diagnosis not present

## 2017-12-13 DIAGNOSIS — M25561 Pain in right knee: Secondary | ICD-10-CM | POA: Diagnosis not present

## 2018-05-16 IMAGING — CT CT ABD-PELV W/ CM
2 of 4 series · 16 of 46 positions shown, 18 images · IV contrast (Isovue)
Comparison: 10/25/2005

CLINICAL DATA: Right lower quadrant pain. Elevated LFTs. Nausea,
vomiting.

EXAM:
CT ABDOMEN AND PELVIS WITH CONTRAST
TECHNIQUE: Multidetector CT imaging of the abdomen and pelvis was performed
using the standard protocol following bolus administration of
intravenous contrast.
CONTRAST:  1 GLMGE7-XSS IOPAMIDOL (GLMGE7-XSS) INJECTION 61%, 100mL
GLMGE7-XSS IOPAMIDOL (GLMGE7-XSS) INJECTION 61%

[Series 2: axial st · axial · 0.63mm/px · z∈[+946,+1406]mm · 13 of 102 slices shown, 15 images]
[im 5/102  soft-tissue]
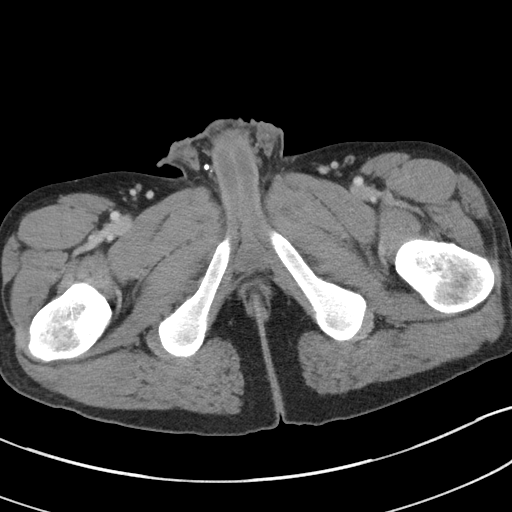
[im 5/102  bone]
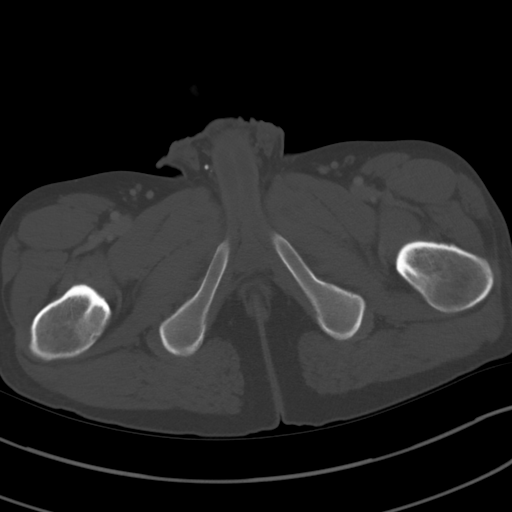
[im 13/102  soft-tissue]
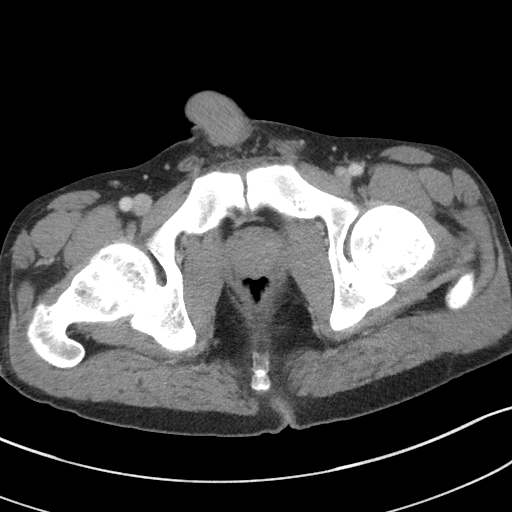
[im 22/102  soft-tissue]
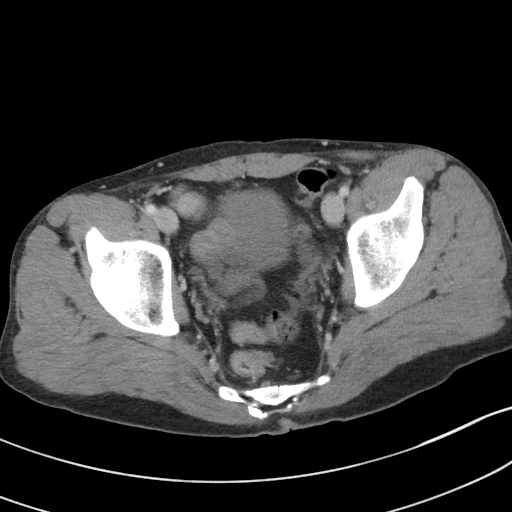
[im 30/102  soft-tissue]
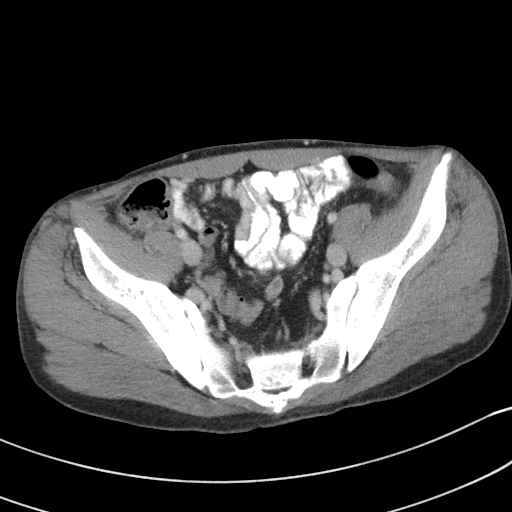
[im 34/102  soft-tissue]
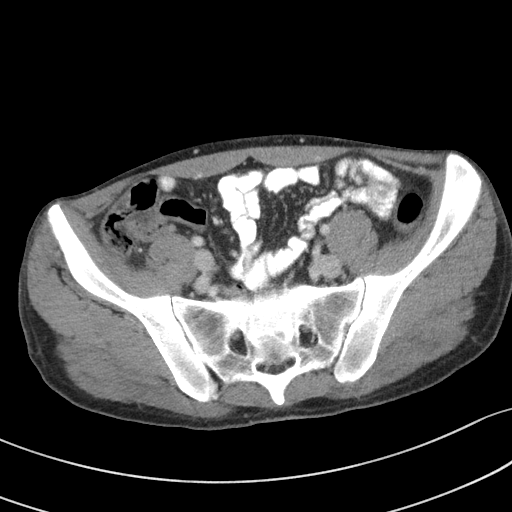
[im 43/102  soft-tissue]
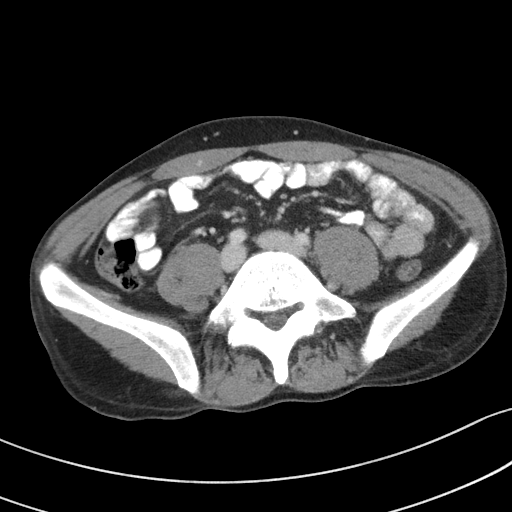
[im 51/102  soft-tissue]
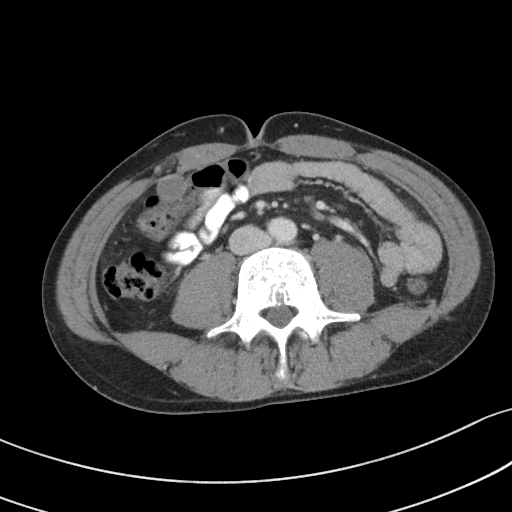
[im 59/102  soft-tissue]
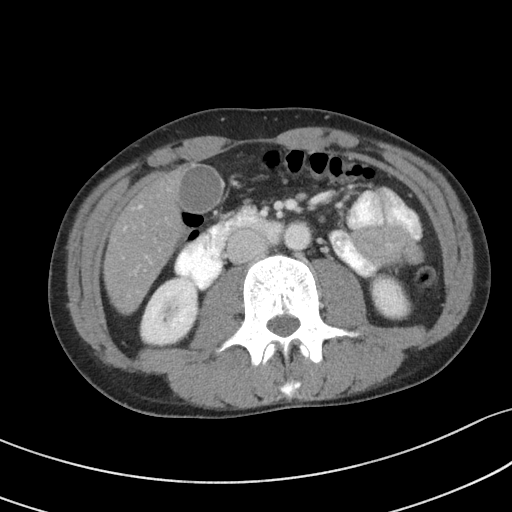
[im 68/102  soft-tissue]
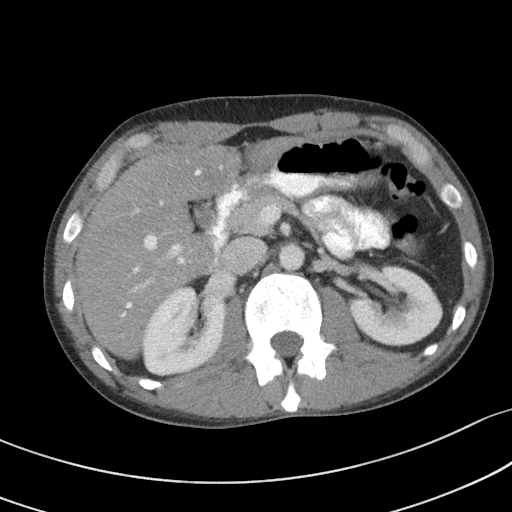
[im 68/102  bone]
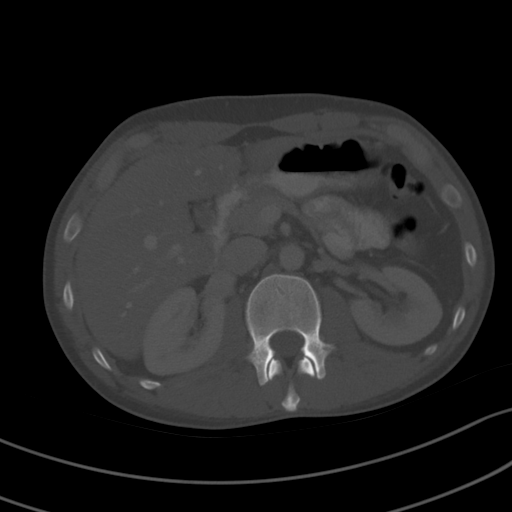
[im 72/102  soft-tissue]
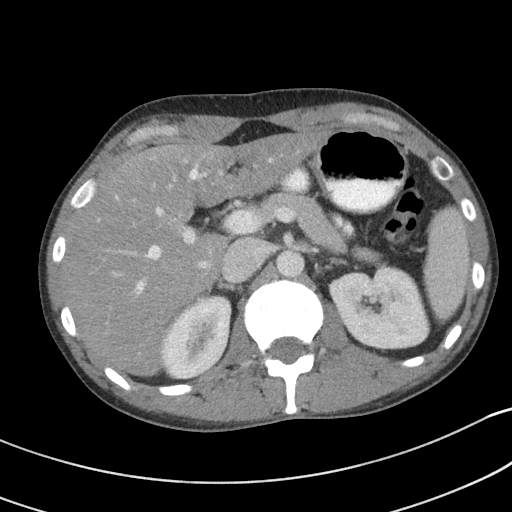
[im 80/102  soft-tissue]
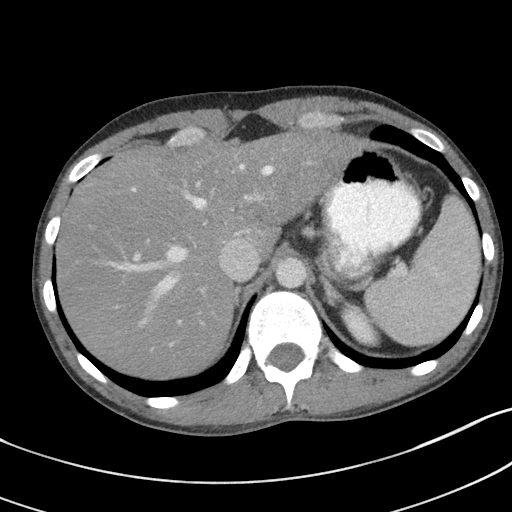
[im 89/102  soft-tissue]
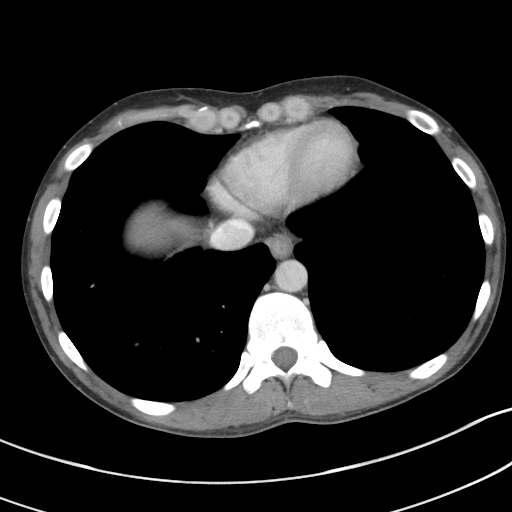
[im 97/102  soft-tissue]
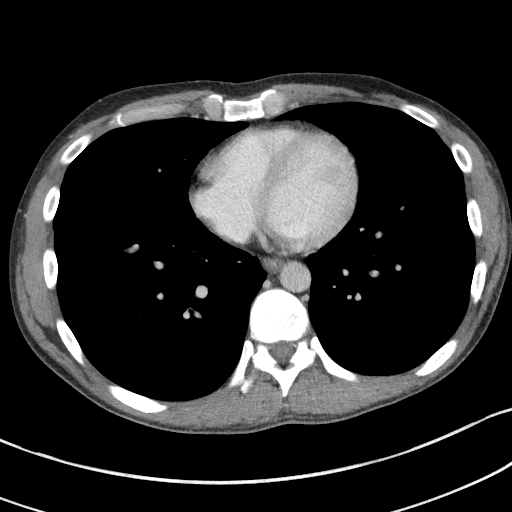

[Series 5: coronal st · coronal · 0.63mm/px · 3 of 76 slices shown]
[im 26/76  soft-tissue]
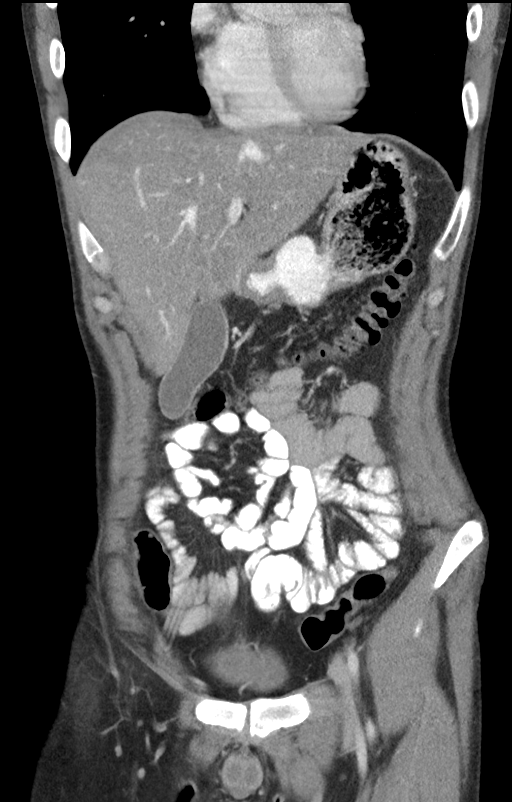
[im 34/76  soft-tissue]
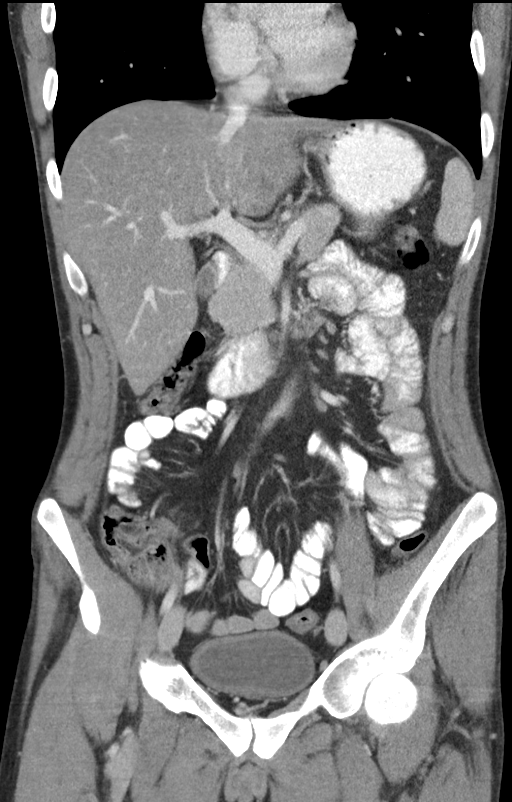
[im 42/76  soft-tissue]
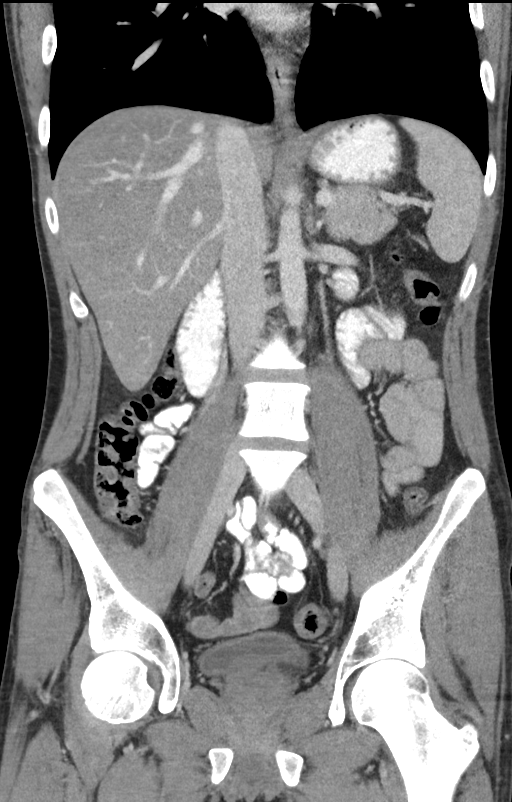

[16 of 46 positions shown; findings below may reference images not displayed]

FINDINGS: Lower chest: Lung bases are clear. No effusions. Heart is normal
size.

Hepatobiliary: Diffuse fatty infiltration of the liver. No focal
abnormality. Gallbladder unremarkable.

Pancreas: No focal abnormality or ductal dilatation.

Spleen: No focal abnormality.  Normal size.

Adrenals/Urinary Tract: No adrenal abnormality. No focal renal
abnormality. No stones or hydronephrosis. Urinary bladder is
unremarkable.

Stomach/Bowel: Normal appendix. Stomach, large and small bowel
grossly unremarkable.

Vascular/Lymphatic: No evidence of aneurysm or adenopathy.

Reproductive: No visible focal abnormality.

Other: No free fluid or free air.

Musculoskeletal: No acute bony abnormality.
IMPRESSION: No acute findings in the abdomen or pelvis.

Fatty infiltration of the liver.

## 2018-06-14 DIAGNOSIS — R945 Abnormal results of liver function studies: Secondary | ICD-10-CM | POA: Diagnosis not present

## 2018-06-20 DIAGNOSIS — D696 Thrombocytopenia, unspecified: Secondary | ICD-10-CM | POA: Diagnosis not present

## 2018-06-20 DIAGNOSIS — Z0000 Encounter for general adult medical examination without abnormal findings: Secondary | ICD-10-CM | POA: Diagnosis not present

## 2018-06-20 DIAGNOSIS — F5101 Primary insomnia: Secondary | ICD-10-CM | POA: Diagnosis not present

## 2018-06-20 DIAGNOSIS — Z6824 Body mass index (BMI) 24.0-24.9, adult: Secondary | ICD-10-CM | POA: Diagnosis not present

## 2018-12-18 DIAGNOSIS — F5101 Primary insomnia: Secondary | ICD-10-CM | POA: Diagnosis not present

## 2018-12-18 DIAGNOSIS — R110 Nausea: Secondary | ICD-10-CM | POA: Diagnosis not present

## 2018-12-18 DIAGNOSIS — F419 Anxiety disorder, unspecified: Secondary | ICD-10-CM | POA: Diagnosis not present

## 2018-12-20 ENCOUNTER — Other Ambulatory Visit: Payer: Self-pay

## 2018-12-20 ENCOUNTER — Encounter (HOSPITAL_COMMUNITY): Payer: Self-pay | Admitting: *Deleted

## 2018-12-20 ENCOUNTER — Emergency Department (HOSPITAL_COMMUNITY): Payer: BC Managed Care – PPO

## 2018-12-20 ENCOUNTER — Emergency Department (HOSPITAL_COMMUNITY)
Admission: EM | Admit: 2018-12-20 | Discharge: 2018-12-20 | Disposition: A | Discharge status: Home / Self Care | Payer: BC Managed Care – PPO | Attending: Emergency Medicine | Admitting: Emergency Medicine

## 2018-12-20 DIAGNOSIS — R1110 Vomiting, unspecified: Secondary | ICD-10-CM | POA: Diagnosis not present

## 2018-12-20 DIAGNOSIS — R634 Abnormal weight loss: Secondary | ICD-10-CM | POA: Insufficient documentation

## 2018-12-20 DIAGNOSIS — R112 Nausea with vomiting, unspecified: Secondary | ICD-10-CM

## 2018-12-20 DIAGNOSIS — Z20828 Contact with and (suspected) exposure to other viral communicable diseases: Secondary | ICD-10-CM | POA: Insufficient documentation

## 2018-12-20 DIAGNOSIS — R197 Diarrhea, unspecified: Secondary | ICD-10-CM | POA: Diagnosis not present

## 2018-12-20 LAB — CBC WITH DIFFERENTIAL/PLATELET
Abs Immature Granulocytes: 0.02 K/uL (ref 0.00–0.07)
Basophils Absolute: 0 K/uL (ref 0.0–0.1)
Basophils Relative: 0 %
Eosinophils Absolute: 0 K/uL (ref 0.0–0.5)
Eosinophils Relative: 0 %
HCT: 45.2 % (ref 39.0–52.0)
Hemoglobin: 15.8 g/dL (ref 13.0–17.0)
Immature Granulocytes: 0 %
Lymphocytes Relative: 25 %
Lymphs Abs: 2.2 K/uL (ref 0.7–4.0)
MCH: 29.5 pg (ref 26.0–34.0)
MCHC: 35 g/dL (ref 30.0–36.0)
MCV: 84.3 fL (ref 80.0–100.0)
Monocytes Absolute: 0.6 K/uL (ref 0.1–1.0)
Monocytes Relative: 6 %
Neutro Abs: 6.1 K/uL (ref 1.7–7.7)
Neutrophils Relative %: 69 %
Platelets: 189 K/uL (ref 150–400)
RBC: 5.36 MIL/uL (ref 4.22–5.81)
RDW: 12.1 % (ref 11.5–15.5)
WBC: 9 K/uL (ref 4.0–10.5)
nRBC: 0 % (ref 0.0–0.2)

## 2018-12-20 LAB — COMPREHENSIVE METABOLIC PANEL WITH GFR
ALT: 15 U/L (ref 0–44)
AST: 13 U/L — ABNORMAL LOW (ref 15–41)
Albumin: 4.6 g/dL (ref 3.5–5.0)
Alkaline Phosphatase: 72 U/L (ref 38–126)
Anion gap: 11 (ref 5–15)
BUN: 11 mg/dL (ref 6–20)
CO2: 24 mmol/L (ref 22–32)
Calcium: 9.2 mg/dL (ref 8.9–10.3)
Chloride: 103 mmol/L (ref 98–111)
Creatinine, Ser: 1.24 mg/dL (ref 0.61–1.24)
GFR calc Af Amer: >60 mL/min
GFR calc non Af Amer: >60 mL/min
Glucose, Bld: 112 mg/dL — ABNORMAL HIGH (ref 70–99)
Potassium: 3.8 mmol/L (ref 3.5–5.1)
Sodium: 138 mmol/L (ref 135–145)
Total Bilirubin: 1.2 mg/dL (ref 0.3–1.2)
Total Protein: 7.5 g/dL (ref 6.5–8.1)

## 2018-12-20 LAB — LIPASE, BLOOD: Lipase: 34 U/L (ref 11–51)

## 2018-12-20 LAB — ETHANOL: Alcohol, Ethyl (B): <10 mg/dL (ref <10)

## 2018-12-20 MED ORDER — IOHEXOL 300 MG/ML  SOLN
100.0000 mL | Freq: Once | INTRAMUSCULAR | Status: AC | PRN
  Administered 2018-12-20: 100 mL via INTRAVENOUS

## 2018-12-20 MED ORDER — ONDANSETRON 4 MG PO TBDP: 4.0000 mg | ORAL_TABLET | Freq: Three times a day (TID) | ORAL | 0 refills | Status: AC | PRN

## 2018-12-20 MED ORDER — ONDANSETRON HCL 4 MG/2ML IJ SOLN
4.0000 mg | Freq: Once | INTRAMUSCULAR | Status: AC
  Administered 2018-12-20: 4 mg via INTRAVENOUS
  Filled 2018-12-20: qty 2

## 2018-12-20 MED ORDER — SODIUM CHLORIDE 0.9 % IV BOLUS
1000.0000 mL | Freq: Once | INTRAVENOUS | Status: AC
  Administered 2018-12-20: 17:00:00 1000 mL via INTRAVENOUS

## 2018-12-20 MED ORDER — HYDROXYZINE HCL 25 MG PO TABS: 25.0000 mg | ORAL_TABLET | Freq: Four times a day (QID) | ORAL | 0 refills | Status: AC

## 2018-12-20 NOTE — ED Provider Notes (Signed)
[facility] EMERGENCY DEPARTMENT Provider Note   CSN: [id number] Arrival date & time: 12/20/18  1232    History Chief Complaint  Patient presents with  . Nausea    Stephen Frey is a 28 y.o. male with past medical history significant for alcohol abuse who presents for evaluation of nausea and vomiting.  Patient states over the last 2 weeks he has had persistent nausea and vomiting.  NBNB emesis.  He has had occasional diarrhea without melena or bright red blood per rectum.  Saw his PCP yesterday, his alcohol who prescribed a medication for anxiety.  Patient states he has had 5-6 episodes of emesis this morning.  States he has lost greater than 15 pounds in the last 2 weeks.  States he did take a home nausea medicine however cannot remember this however immediately had emesis.  States he is socially drinking however not as much as previously.  Denies DTs or withdrawal seizures.  He denies any abdominal pain.  States he will occasionally get burning and reflux up into his chest however none over the last 3 days.  No prior abdominal surgeries.  Denies fever, chills, chest pain, shortness of breath, hemoptysis, dysuria, constipation.  Denies additional aggravating or alleviating factors.  Denies NSAID use, esophageal varices.  History obtained from patient and past medical records.  No interpreter is used.  HPI     History reviewed. No pertinent past medical history.  There are no problems to display for this patient.   Past Surgical History:  Procedure Laterality Date  . FOOT SURGERY    . KNEE SURGERY         History reviewed. No pertinent family history.  Social History   Tobacco Use  . Smoking status: Never Smoker  . Smokeless tobacco: Current User    Types: Snuff  Substance Use Topics  . Alcohol use: Yes    Comment: socially   . Drug use: No    Home Medications Prior to Admission medications   Medication Sig Start Date End Date Taking? Authorizing Provider    chlordiazePOXIDE (LIBRIUM) 25 MG capsule 50mg  PO TID x 1D, then 25-50mg  PO BID X 1D, then 25-50mg  PO QD X 1D 03/22/16   Idol, 03/24/16, PA-C  ibuprofen (ADVIL,MOTRIN) 200 MG tablet Take 200 mg by mouth every 6 (six) hours as needed for moderate pain.    [provider]  Multiple Vitamin (MULTIVITAMIN WITH MINERALS) TABS tablet Take 1 tablet by mouth daily.    [provider]  ondansetron (ZOFRAN ODT) 8 MG disintegrating tablet Take 1 tablet (8 mg total) by mouth every 8 (eight) hours as needed for nausea or vomiting. 02/16/16   04/15/16, MD  promethazine (PHENERGAN) 25 MG tablet Take 1 tablet (25 mg total) by mouth every 6 (six) hours as needed for nausea or vomiting. 03/22/16   03/24/16, PA-C    Allergies    Vancomycin  Review of Systems   Review of Systems  Constitutional: Positive for unexpected weight change. Negative for activity change, appetite change, chills, diaphoresis, fatigue and fever.  HENT: Negative.   Eyes: Negative.   Respiratory: Negative.   Cardiovascular: Negative.   Gastrointestinal: Positive for diarrhea, nausea and vomiting. Negative for abdominal distention, abdominal pain, anal bleeding, blood in stool, constipation and rectal pain.  Genitourinary: Negative.   Musculoskeletal: Negative.   Skin: Negative.   Neurological: Negative.   All other systems reviewed and are negative.   Physical Exam Updated Vital Signs BP 128/80 (BP  Location: Right Arm)   Pulse 78   Temp (!) 96.7 F (35.9 C) (Temporal)   Resp 20   Ht 6\' 3"  (1.905 m)   Wt 87.1 kg   SpO2 99%   BMI 24.00 kg/m   Physical Exam Vitals and nursing note reviewed.  Constitutional:      General: He is not in acute distress.    Appearance: He is not ill-appearing, toxic-appearing or diaphoretic.  HENT:     Head: Normocephalic and atraumatic.     Jaw: There is normal jaw occlusion.     Right Ear: Tympanic membrane, ear canal and external ear normal. There is no impacted cerumen.  No hemotympanum. Tympanic membrane is not injected, scarred, perforated, erythematous, retracted or bulging.     Left Ear: Tympanic membrane, ear canal and external ear normal. There is no impacted cerumen. No hemotympanum. Tympanic membrane is not injected, scarred, perforated, erythematous, retracted or bulging.     Ears:     Comments: No Mastoid tenderness.    Nose:     Comments: Clear rhinorrhea and congestion to bilateral nares.  No sinus tenderness.    Mouth/Throat:     Comments: Posterior oropharynx clear.  Mucous membranes moist.  Tonsils without erythema or exudate.  Uvula midline without deviation.  No evidence of PTA or RPA.  No drooling, dysphasia or trismus.  Phonation normal. Neck:     Trachea: Trachea and phonation normal.     Meningeal: Brudzinski's sign and Kernig's sign absent.     Comments: No Neck stiffness or neck rigidity.  No meningismus.  No cervical lymphadenopathy. Cardiovascular:     Comments: No murmurs rubs or gallops. Pulmonary:     Comments: Clear to auscultation bilaterally without wheeze, rhonchi or rales.  No accessory muscle usage.  Able speak in full sentences. Abdominal:     General: Bowel sounds are normal. There is no distension.     Tenderness: There is abdominal tenderness in the epigastric area. There is no right CVA tenderness, left CVA tenderness, guarding or rebound.     Hernia: No hernia is present.     Comments: Soft, without rebound or guarding.  No CVA tenderness. Mild tenderness to epigastrium. Negative Murphy sign/  Musculoskeletal:        General: Normal range of motion.     Comments: Moves all 4 extremities without difficulty.  Lower extremities without edema, erythema or warmth.  Skin:    General: Skin is warm.     Capillary Refill: Capillary refill takes less than 2 seconds.     Comments: Brisk capillary refill.  No rashes or lesions.  Neurological:     General: No focal deficit present.     Mental Status: He is alert and oriented  to person, place, and time.     Gait: Gait normal.     Comments: Ambulatory in department without difficulty.  Cranial nerves II through XII grossly intact.  No facial droop.  No aphasia.    ED Results / Procedures / Treatments   Labs (all labs ordered are listed, but only abnormal results are displayed) Labs Reviewed  COMPREHENSIVE METABOLIC PANEL - Abnormal; Notable for the following components:      Result Value   Glucose, Bld 112 (*)    AST 13 (*)    All other components within normal limits  CBC WITH DIFFERENTIAL/PLATELET  LIPASE, BLOOD  ETHANOL  URINALYSIS, ROUTINE W REFLEX MICROSCOPIC  RAPID URINE DRUG SCREEN, HOSP PERFORMED    EKG None  Radiology No results found.  Procedures Procedures (including critical care time)  Medications Ordered in ED Medications  sodium chloride 0.9 % bolus 1,000 mL (has no administration in time range)  ondansetron (ZOFRAN) injection 4 mg (has no administration in time range)   ED Course  I have reviewed the triage vital signs and the nursing notes.  Pertinent labs & imaging results that were available during my care of the patient were reviewed by me and considered in my medical decision making (see chart for details).  28 year old presents for evaluation of persistent nausea and emesis over the last 2 weeks.  Afebrile, nonseptic, non-ill-appearing.  No associated abdominal pain no prior abdominal surgeries.  Mild epigastric tenderness however negative Murphy sign.  No rebound or guarding.  No overlying skin changes.  Normoactive bowel sounds.  Has had associated 15 pound weight loss however patient relates this to inability to tolerate p.o. intake.  Heart and lungs clear.  He has had some reflux symptoms.  He denies any NSAID use however does have a history of alcohol abuse.  Patient states he is no longer drinking chronically.  No history of DTs or withdrawal seizures.  No hypertension, tachycardia, tremors on exam.  I have low suspicion  for withdrawals.  Recently started on anxiety medicine yesterday by his PCP thinking symptoms were likely psychogenic in nature.  Plan for labs, imaging, IV fluids, antiemetics and reevaluate.  [facility] who will determine disposition. Labs and imaging pending at care transfer. If labs w/o significant findings and imaging negative. Plan to PO challenge. If passes PO can dc home with antiemetics and GI follow up.    MDM Rules/Calculators/A&P                       Final Clinical Impression(s) / ED Diagnoses Final diagnoses:  Nausea and vomiting, intractability of vomiting not specified, unspecified vomiting type    Rx / DC Orders ED Discharge Orders    None       Hulen Mandler A, PA-C 12/20/18 1627    Vanetta MuldersZackowski, Scott, MD 12/20/18 1705

## 2018-12-20 NOTE — ED Triage Notes (Addendum)
Patient has had nausea/vomiting occasional diarrhea for 2 weeks.  Patient had seen his PCP Dr. Wende Neighbors yesterday and began a new medication for anxiety.  Patient vomited 5-6 times morning.  Patient has had a loss of >10 lbs in the last 2 weeks.

## 2018-12-20 NOTE — Discharge Instructions (Signed)
Please quarantine until you get there results of your COVID test which could take up to 48 hours Continue Sertraline Take Hydroxyzine as needed for anxiety or difficulty sleeping. This medication will make you drowsy Take Zofran as needed for nausea Please follow up with your primary doctor

## 2018-12-20 NOTE — ED Provider Notes (Signed)
28 year old with intractable N/V, weight loss for one month. Vitals are normal. Thought to be anxiety and was started on Zoloft yesterday but pt had panic attack today after taking the pill and lying down and therefore he decided to come to the ED. Labs are normal. CT A/P pending at shift change.  CT abdomen/pelvis shows diverticulitis without evidence of diverticulosis. Pt rechecked and feels better. He is visibly anxious and endorses this as a possible reason for his symptoms. UA and UDS were pending but he has no urinary symptoms and I do not feel this will change management. COVID swab ordered per his request. He was given rx for Zofran and Hydroxyzine and advised PCP f/u.   Recardo Evangelist, PA-C 12/21/18 0100    Fredia Sorrow, MD 12/24/18 [phone]

## 2018-12-22 LAB — NOVEL CORONAVIRUS, NAA (HOSP ORDER, SEND-OUT TO REF LAB; TAT 18-24 HRS): SARS-CoV-2, NAA: NOT DETECTED

## 2019-01-24 DIAGNOSIS — R110 Nausea: Secondary | ICD-10-CM | POA: Diagnosis not present

## 2019-01-24 DIAGNOSIS — F5101 Primary insomnia: Secondary | ICD-10-CM | POA: Diagnosis not present

## 2019-01-24 DIAGNOSIS — F419 Anxiety disorder, unspecified: Secondary | ICD-10-CM | POA: Diagnosis not present

## 2019-04-04 DIAGNOSIS — Z23 Encounter for immunization: Secondary | ICD-10-CM | POA: Diagnosis not present

## 2019-04-27 DIAGNOSIS — Z23 Encounter for immunization: Secondary | ICD-10-CM | POA: Diagnosis not present

## 2019-06-26 DIAGNOSIS — F5101 Primary insomnia: Secondary | ICD-10-CM | POA: Diagnosis not present

## 2019-06-26 DIAGNOSIS — F1010 Alcohol abuse, uncomplicated: Secondary | ICD-10-CM | POA: Diagnosis not present

## 2019-06-26 DIAGNOSIS — F419 Anxiety disorder, unspecified: Secondary | ICD-10-CM | POA: Diagnosis not present

## 2019-06-26 DIAGNOSIS — F331 Major depressive disorder, recurrent, moderate: Secondary | ICD-10-CM | POA: Diagnosis not present

## 2019-06-26 DIAGNOSIS — D696 Thrombocytopenia, unspecified: Secondary | ICD-10-CM | POA: Diagnosis not present

## 2019-07-03 DIAGNOSIS — Z0001 Encounter for general adult medical examination with abnormal findings: Secondary | ICD-10-CM | POA: Diagnosis not present

## 2020-05-29 DIAGNOSIS — E782 Mixed hyperlipidemia: Secondary | ICD-10-CM | POA: Diagnosis not present

## 2020-05-29 DIAGNOSIS — D696 Thrombocytopenia, unspecified: Secondary | ICD-10-CM | POA: Diagnosis not present

## 2020-05-29 DIAGNOSIS — F411 Generalized anxiety disorder: Secondary | ICD-10-CM | POA: Diagnosis not present

## 2020-05-29 DIAGNOSIS — F5101 Primary insomnia: Secondary | ICD-10-CM | POA: Diagnosis not present

## 2020-08-28 DIAGNOSIS — E782 Mixed hyperlipidemia: Secondary | ICD-10-CM | POA: Diagnosis not present

## 2020-09-02 DIAGNOSIS — Z0001 Encounter for general adult medical examination with abnormal findings: Secondary | ICD-10-CM | POA: Diagnosis not present

## 2021-03-14 IMAGING — CT CT ABD-PELV W/ CM
2 of 4 series · 16 of 46 positions shown, 18 images · IV contrast (Omnipaque or Isovue)
Comparison: 02/21/2016

CLINICAL DATA: Nausea and vomiting for 2 weeks

EXAM:
CT ABDOMEN AND PELVIS WITH CONTRAST
TECHNIQUE: Multidetector CT imaging of the abdomen and pelvis was performed
using the standard protocol following bolus administration of
intravenous contrast.
CONTRAST:  100mL OMNIPAQUE IOHEXOL 300 MG/ML  SOLN

[Series 2: axial st · axial · 0.74mm/px · z∈[+918,+1358]mm · 13 of 100 slices shown, 15 images]
[im 6/100  soft-tissue]
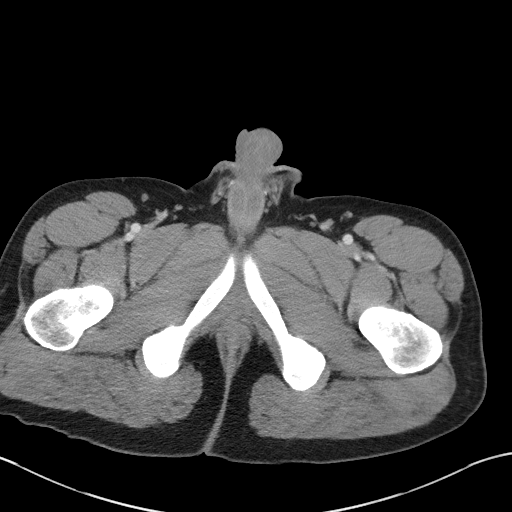
[im 6/100  bone]
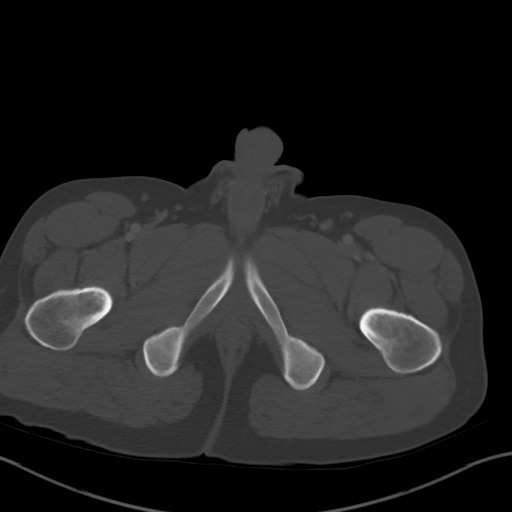
[im 16/100  soft-tissue]
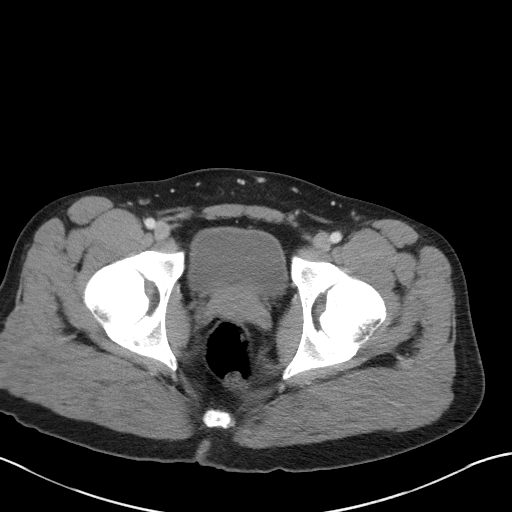
[im 21/100  soft-tissue]
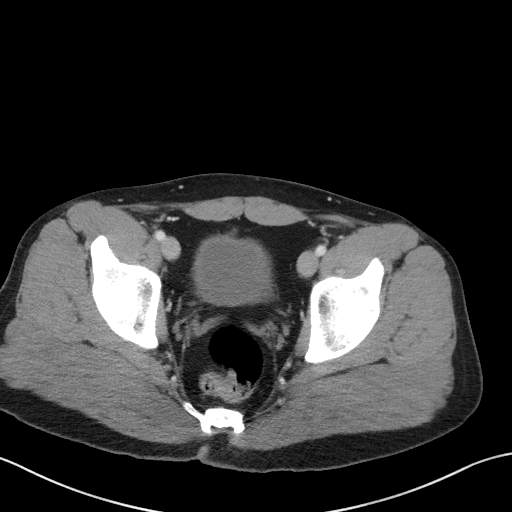
[im 27/100  soft-tissue]
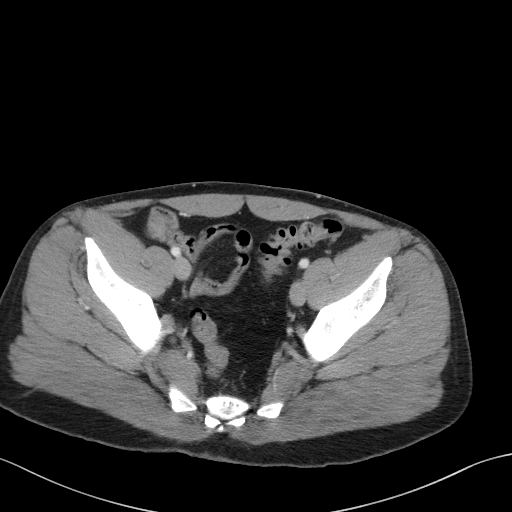
[im 37/100  soft-tissue]
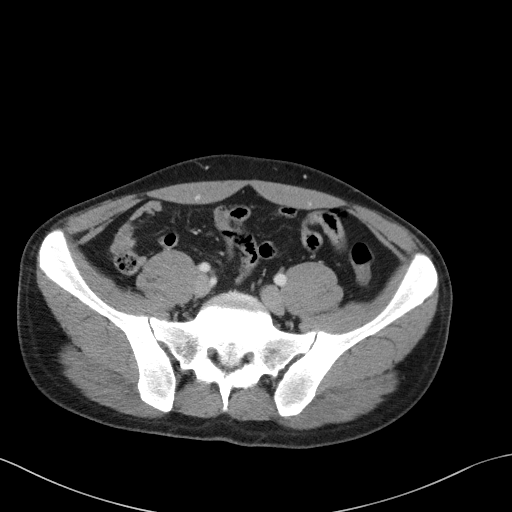
[im 42/100  soft-tissue]
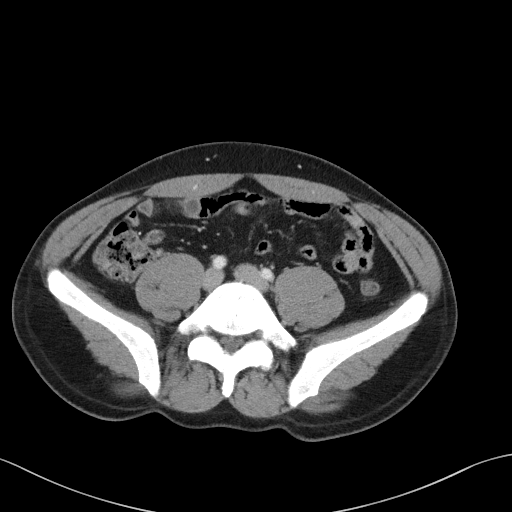
[im 53/100  soft-tissue]
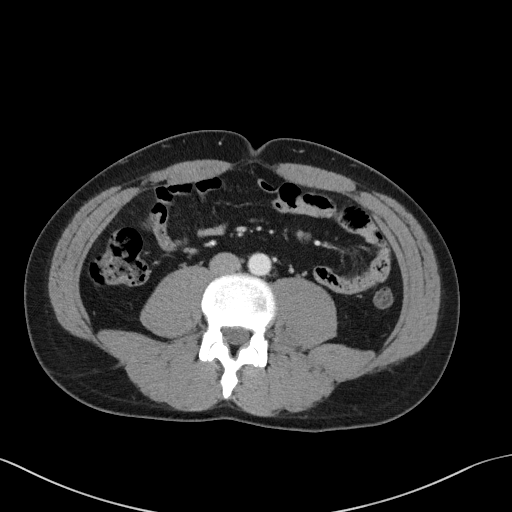
[im 58/100  soft-tissue]
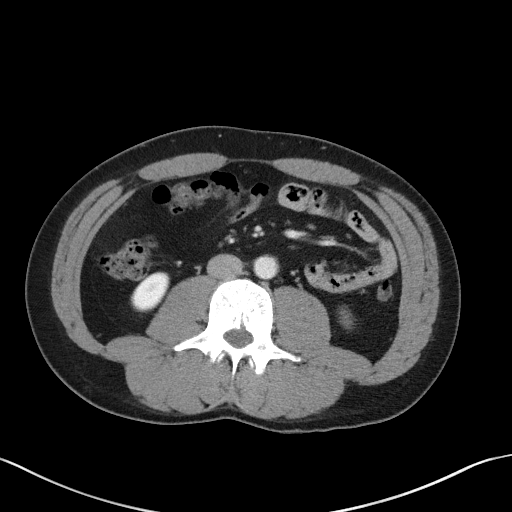
[im 63/100  soft-tissue]
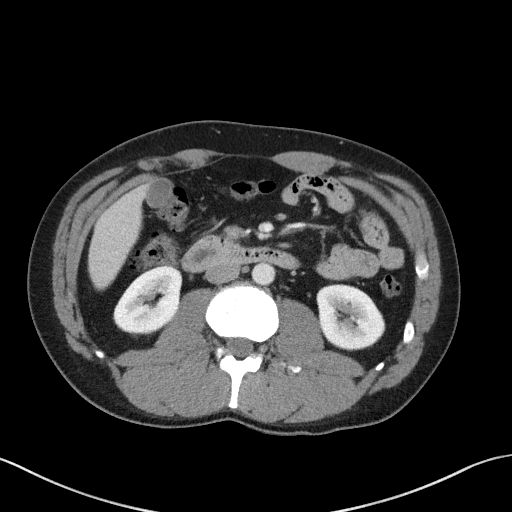
[im 63/100  bone]
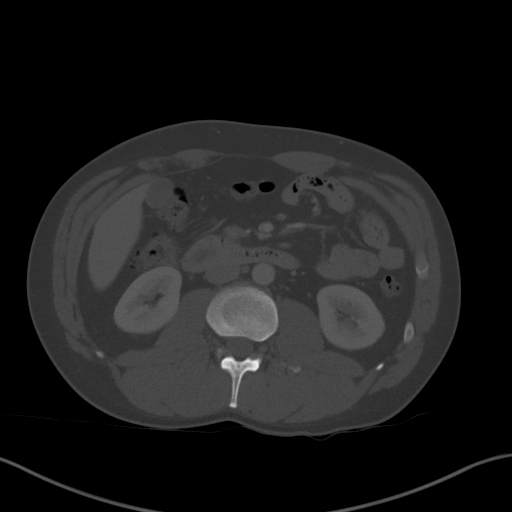
[im 73/100  soft-tissue]
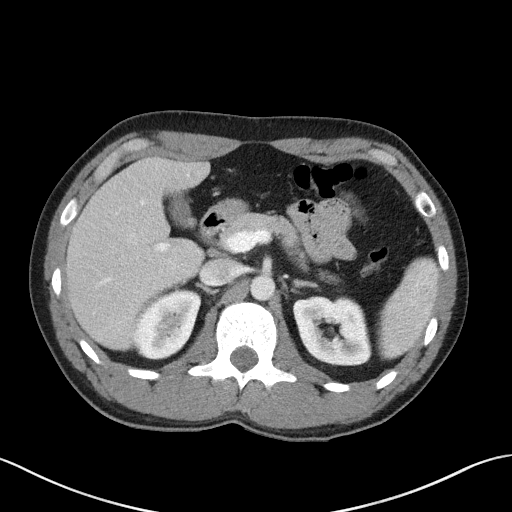
[im 79/100  soft-tissue]
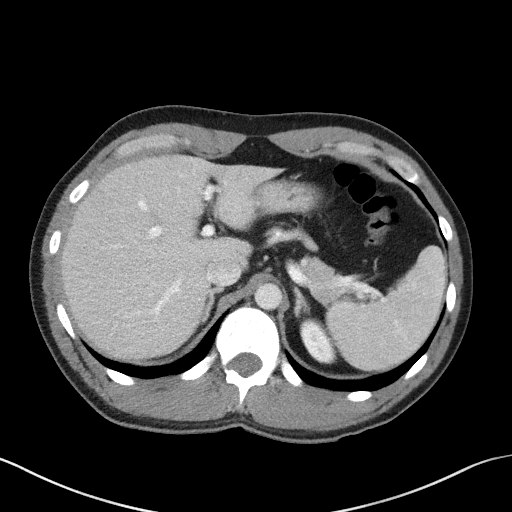
[im 84/100  soft-tissue]
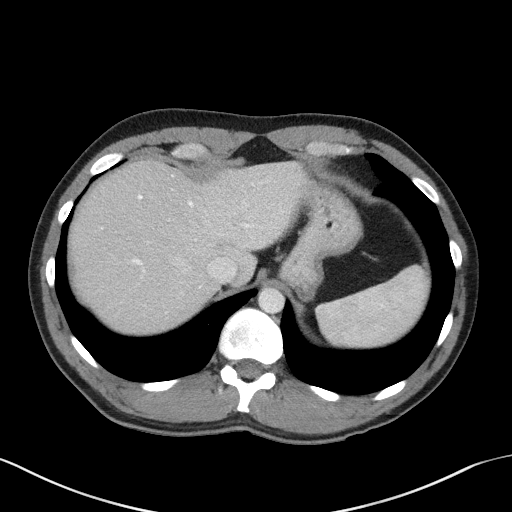
[im 94/100  soft-tissue]
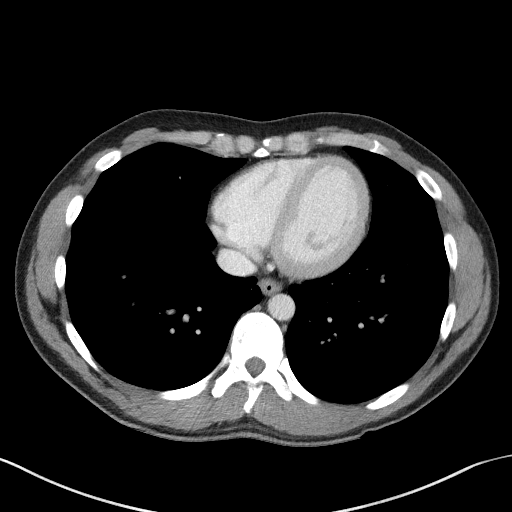

[Series 5: coronal st · coronal · 0.76mm/px · 3 of 93 slices shown]
[im 31/93  soft-tissue]
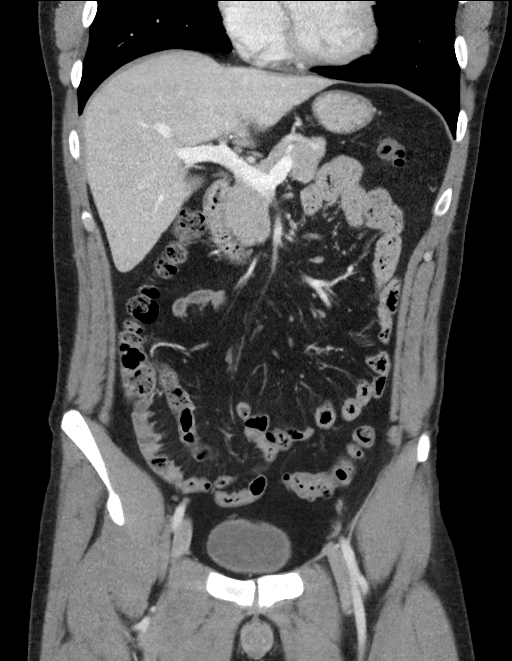
[im 41/93  soft-tissue]
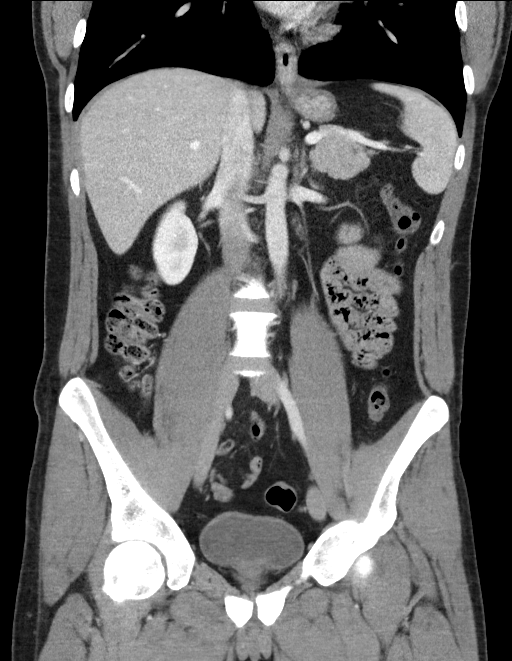
[im 52/93  soft-tissue]
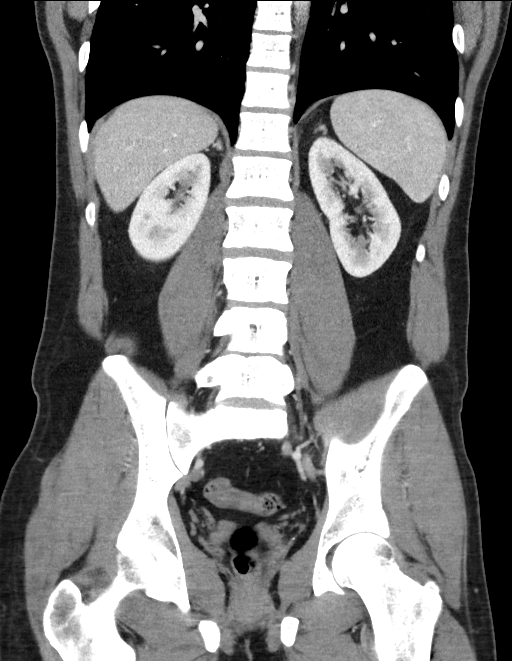

[16 of 46 positions shown; findings below may reference images not displayed]

FINDINGS: Lower chest: No acute abnormality.

Hepatobiliary: No focal liver abnormality is seen. No gallstones,
gallbladder wall thickening, or biliary dilatation.

Pancreas: Unremarkable. No pancreatic ductal dilatation or
surrounding inflammatory changes.

Spleen: Normal in size without focal abnormality.

Adrenals/Urinary Tract: Adrenal glands are within normal limits.
Kidneys demonstrate a normal enhancement pattern bilaterally. No
renal calculi are seen. No obstructive changes are noted. The
bladder is partially distended.

Stomach/Bowel: Mild diverticular change of the colon is noted. No
evidence of diverticulitis is seen. The appendix is within normal
limits. No inflammatory changes are noted. Small bowel and stomach
unremarkable.

Vascular/Lymphatic: No significant vascular findings are present. No
enlarged abdominal or pelvic lymph nodes.

Reproductive: Prostate is unremarkable.

Other: No abdominal wall hernia or abnormality. No abdominopelvic
ascites.

Musculoskeletal: No acute or significant osseous findings.
IMPRESSION: Diverticulosis without diverticulitis. No other focal abnormality is
noted.

## 2021-05-10 ENCOUNTER — Ambulatory Visit (HOSPITAL_COMMUNITY)
Admission: RE | Admit: 2021-05-10 | Discharge: 2021-05-10 | Disposition: A | Discharge status: Home / Self Care | Payer: PRIVATE HEALTH INSURANCE | Attending: Psychiatry | Admitting: Psychiatry

## 2021-05-10 DIAGNOSIS — F1029 Alcohol dependence with unspecified alcohol-induced disorder: Secondary | ICD-10-CM

## 2021-05-10 DIAGNOSIS — F331 Major depressive disorder, recurrent, moderate: Secondary | ICD-10-CM | POA: Insufficient documentation

## 2021-05-10 DIAGNOSIS — F1020 Alcohol dependence, uncomplicated: Secondary | ICD-10-CM | POA: Diagnosis present

## 2021-05-10 NOTE — H&P (Signed)
Behavioral Health Medical Screening Exam ? ?HPI: Stephen Frey is a 31 y.o. Caucasian male who present to [facility] accompanied by his mother for complaint of chronic alcohol intoxication in the context of increased responsibility at workplace, (in the past one month), separation from girlfriend for past 2 weeks, and his dog being currently sick. Patient lives by himself and works for [employer] in [place]. No past psychiatric/medical history noted on the problem list. However, on chart review, noted several ED visits for hx  of ETOH abuse/non-intractable vomiting since 2018. Patient lives alone in his apartment. ? ?Patient reported he has been drinking several cases of beer daily (to stupor) for the past 3 weeks due to the above indicated stressors. Reoprted he got very sick yesterday with shaking, sweating, being hot/cold, not sleeping, confusion, and not eating since Friday. Denied SI/HI/AVH. Denied any suicidal attempt in the past or currently. Denied any self injurious behavior, or family history of mental illness. Denied being followed by a therapist or a psychiatrist. However, stated that he is yet to schedule an appointment with [employer] from his work for help. Denied drug use and only chew tobacco occasionally. Instruction provided on tobacco cessation and its adverse effect on the body system. Denied history of trauma or abuse and access to fire arms. Endorsed self isolation and irritability, sleeping for 3 hours last night and good support system to be mother and his set of friends. ? ?On assessment today, patient was examined sitting up in a chair in the screening room. Chart reviewed and findings shared with the tx team and Dr. Lucianne Muss. Alert and oriented to person, time, place and situation. Speech fluent, with normal pattern and volume. Mood/Affect appropriate, depressed, anxious and full range. Thought process coherent, goal directed and linear. Thought content logical and within normal limit. Memory,  judgement and insight fair. Observed fine tremors to fingers. ? ?Based on my evaluation of patient, there is no imminent danger to self and others and does not meet the criteria for in-patient admission at this time. Community resources for alcohol detox and individual counseling provided for patient. National Suicide prevention lifeline and crises management provided for patient. Both patient and mother left [facility] without any incident. ? ?Total Time spent with patient: 30 minutes ? ?Psychiatric Specialty Exam: ? ?Presentation  ?General Appearance: Appropriate for Environment; Casual; Fairly Groomed ? ?Eye Contact:Good ? ?Speech:Clear and Coherent; Normal Rate ? ?Speech Volume:Normal ? ?Handedness:Right ? ?Mood and Affect  ?Mood:Anxious; Depressed ? ?Affect:Appropriate; Depressed; Full Range ? ?Thought Process  ?Thought Processes:Coherent; Goal Directed; Linear ? ?Descriptions of Associations:Intact ? ?Orientation:Full (Time, Place and Person) ? ?Thought Content:Logical; WDL ? ?History of Schizophrenia/Schizoaffective disorder:No data recorded ?Duration of Psychotic Symptoms:No data recorded ?Hallucinations:Hallucinations: None ? ?Ideas of Reference:None ? ?Suicidal Thoughts:Suicidal Thoughts: No ? ?Homicidal Thoughts:Homicidal Thoughts: No ? ?Sensorium  ?Memory:Immediate Fair; Recent Fair; Remote Fair ? ?Judgment:Fair ? ?Insight:Fair ? ?Executive Functions  ?Concentration:Fair ? ?Attention Span:Good ? ?Recall:Good ? ?Fund of Knowledge:Fair ? ?Language:Good ? ?Psychomotor Activity  ?Psychomotor Activity:Psychomotor Activity: Tremor ? ?Assets  ?Assets:Communication Skills; Financial Resources/Insurance; Social Support; Desire for Improvement ? ?Sleep  ?Sleep:Sleep: Fair ?Number of Hours of Sleep: 3 ? ?Physical Exam: ?Physical Exam ?Vitals and nursing note reviewed.  ?Constitutional:   ?   Appearance: Normal appearance.  ?HENT:  ?   Head: Normocephalic.  ?   Right Ear: External ear normal.  ?   Left Ear: External ear  normal.  ?   Mouth/Throat:  ?   Mouth: Mucous  membranes are moist.  ?   Pharynx: Oropharynx is clear.  ?Eyes:  ?   Extraocular Movements: Extraocular movements intact.  ?   Conjunctiva/sclera: Conjunctivae normal.  ?   Pupils: Pupils are equal, round, and reactive to light.  ?Cardiovascular:  ?   Rate and Rhythm: Normal rate.  ?   Pulses: Normal pulses.  ?Pulmonary:  ?   Effort: Pulmonary effort is normal.  ?Abdominal:  ?   Palpations: Abdomen is soft.  ?Genitourinary: ?   Comments: deferred ?Musculoskeletal:     ?   General: Normal range of motion.  ?   Cervical back: Normal range of motion and neck supple.  ?Skin: ?   General: Skin is warm.  ?Neurological:  ?   General: No focal deficit present.  ?   Mental Status: He is alert and oriented to person, place, and time.  ?Psychiatric:     ?   Behavior: Behavior normal.  ? ?Review of Systems  ?Constitutional: Negative.  Negative for chills and fever.  ?HENT: Negative.  Negative for hearing loss and tinnitus.   ?Eyes: Negative.  Negative for blurred vision and double vision.  ?Respiratory: Negative.  Negative for cough, sputum production, shortness of breath and wheezing.   ?Cardiovascular: Negative.  Negative for chest pain and palpitations.  ?     BP 133/98  ?Gastrointestinal: Negative.  Negative for abdominal pain, constipation, heartburn, nausea and vomiting.  ?Genitourinary: Negative.  Negative for dysuria, frequency and urgency.  ?Musculoskeletal: Negative.  Negative for back pain, falls, joint pain, myalgias and neck pain.  ?Skin: Negative.  Negative for itching and rash.  ?Neurological: Negative.  Negative for dizziness, tingling, tremors, sensory change, speech change, focal weakness, seizures, loss of consciousness, weakness and headaches.  ?Endo/Heme/Allergies: Negative.  Negative for environmental allergies and polydipsia. Does not bruise/bleed easily.  ?     Vancomycin Vancomycin   Not Specified  02/15/2016 ? ?  ?Psychiatric/Behavioral:  Positive for  substance abuse. The patient is nervous/anxious and has insomnia.   ?Blood pressure (!) 133/98, pulse 85, temperature 98.6 ?F (37 ?C), temperature source Oral, resp. rate 18, SpO2 97 %. There is no height or weight on file to calculate BMI. ? ?Musculoskeletal: ?Strength & Muscle Tone: within normal limits ?Gait & Station: normal ?Patient leans: N/A ? ? ?Recommendations: ? ?Based on my evaluation the patient does not appear to have an emergency psychiatric condition. ? ?Cecilie Lowers, FNP ?05/10/2021, 12:25 PM ? ?
